# Patient Record
Sex: Female | Born: 1990 | Race: Black or African American | Hispanic: No | Marital: Single | Smoking: Current every day smoker
Health system: Southern US, Community
[De-identification: ages and names within clinical notes are randomized; demographics above are authoritative.]

## PROBLEM LIST (undated history)

## (undated) DIAGNOSIS — I639 Cerebral infarction, unspecified: Secondary | ICD-10-CM

## (undated) DIAGNOSIS — N289 Disorder of kidney and ureter, unspecified: Secondary | ICD-10-CM

## (undated) DIAGNOSIS — E119 Type 2 diabetes mellitus without complications: Secondary | ICD-10-CM

## (undated) DIAGNOSIS — I10 Essential (primary) hypertension: Secondary | ICD-10-CM

## (undated) DIAGNOSIS — R531 Weakness: Secondary | ICD-10-CM

## (undated) DIAGNOSIS — J189 Pneumonia, unspecified organism: Secondary | ICD-10-CM

## (undated) DIAGNOSIS — F419 Anxiety disorder, unspecified: Secondary | ICD-10-CM

## (undated) HISTORY — PX: TUBAL LIGATION: SHX77

---

## 2008-03-15 ENCOUNTER — Emergency Department (HOSPITAL_COMMUNITY): Admission: EM | Admit: 2008-03-15 | Discharge: 2008-03-15 | Payer: Self-pay | Admitting: Emergency Medicine

## 2010-04-20 LAB — POCT URINALYSIS DIP (DEVICE)
Bilirubin Urine: NEGATIVE
Ketones, ur: 15 mg/dL — AB
Nitrite: NEGATIVE

## 2010-04-20 LAB — POCT PREGNANCY, URINE: Preg Test, Ur: NEGATIVE

## 2012-04-28 DIAGNOSIS — O24111 Pre-existing diabetes mellitus, type 2, in pregnancy, first trimester: Secondary | ICD-10-CM | POA: Insufficient documentation

## 2013-02-02 DIAGNOSIS — E11319 Type 2 diabetes mellitus with unspecified diabetic retinopathy without macular edema: Secondary | ICD-10-CM | POA: Insufficient documentation

## 2013-02-02 DIAGNOSIS — E1121 Type 2 diabetes mellitus with diabetic nephropathy: Secondary | ICD-10-CM | POA: Insufficient documentation

## 2013-02-04 DIAGNOSIS — Z87891 Personal history of nicotine dependence: Secondary | ICD-10-CM | POA: Insufficient documentation

## 2013-06-13 ENCOUNTER — Emergency Department: Payer: Self-pay | Admitting: Internal Medicine

## 2013-10-15 ENCOUNTER — Emergency Department: Payer: Self-pay | Admitting: Emergency Medicine

## 2013-10-15 LAB — CBC
HCT: 39.9 % (ref 35.0–47.0)
HGB: 12.6 g/dL (ref 12.0–16.0)
MCH: 28.6 pg (ref 26.0–34.0)
MCHC: 31.6 g/dL — AB (ref 32.0–36.0)
MCV: 90 fL (ref 80–100)
Platelet: 242 10*3/uL (ref 150–440)
RBC: 4.41 10*6/uL (ref 3.80–5.20)
RDW: 13.4 % (ref 11.5–14.5)
WBC: 9.5 10*3/uL (ref 3.6–11.0)

## 2013-10-15 LAB — BASIC METABOLIC PANEL
Anion Gap: 6 — ABNORMAL LOW (ref 7–16)
BUN: 14 mg/dL (ref 7–18)
CHLORIDE: 107 mmol/L (ref 98–107)
CO2: 27 mmol/L (ref 21–32)
CREATININE: 0.64 mg/dL (ref 0.60–1.30)
Calcium, Total: 8.5 mg/dL (ref 8.5–10.1)
Glucose: 188 mg/dL — ABNORMAL HIGH (ref 65–99)
OSMOLALITY: 285 (ref 275–301)
Potassium: 3.8 mmol/L (ref 3.5–5.1)
SODIUM: 140 mmol/L (ref 136–145)

## 2013-10-15 LAB — URINALYSIS, COMPLETE
BACTERIA: NONE SEEN
BILIRUBIN, UR: NEGATIVE
KETONE: NEGATIVE
LEUKOCYTE ESTERASE: NEGATIVE
NITRITE: NEGATIVE
PH: 5 (ref 4.5–8.0)
Protein: 100
Specific Gravity: 1.024 (ref 1.003–1.030)
Squamous Epithelial: 9

## 2013-10-15 LAB — WET PREP, GENITAL

## 2013-10-16 LAB — GC/CHLAMYDIA PROBE AMP

## 2014-04-13 ENCOUNTER — Emergency Department
Admit: 2014-04-13 | Disposition: A | Discharge status: Home / Self Care | Payer: Self-pay | Admitting: Emergency Medicine

## 2014-04-13 LAB — BASIC METABOLIC PANEL
Anion Gap: 7 (ref 7–16)
BUN: 15 mg/dL
CHLORIDE: 103 mmol/L
CO2: 26 mmol/L
CREATININE: 0.8 mg/dL
Calcium, Total: 9.1 mg/dL
GLUCOSE: 241 mg/dL — AB
Potassium: 3.7 mmol/L
Sodium: 136 mmol/L

## 2014-04-13 LAB — CBC WITH DIFFERENTIAL/PLATELET
BASOS ABS: 0 10*3/uL (ref 0.0–0.1)
BASOS PCT: 0.5 %
EOS ABS: 0.1 10*3/uL (ref 0.0–0.7)
Eosinophil %: 1.2 %
HCT: 40.4 % (ref 35.0–47.0)
HGB: 13.6 g/dL (ref 12.0–16.0)
LYMPHS ABS: 1.1 10*3/uL (ref 1.0–3.6)
LYMPHS PCT: 11.3 %
MCH: 30 pg (ref 26.0–34.0)
MCHC: 33.6 g/dL (ref 32.0–36.0)
MCV: 89 fL (ref 80–100)
MONO ABS: 0.6 x10 3/mm (ref 0.2–0.9)
Monocyte %: 6.5 %
NEUTROS ABS: 7.7 10*3/uL — AB (ref 1.4–6.5)
Neutrophil %: 80.5 %
Platelet: 203 10*3/uL (ref 150–440)
RBC: 4.53 10*6/uL (ref 3.80–5.20)
RDW: 14.1 % (ref 11.5–14.5)
WBC: 9.6 10*3/uL (ref 3.6–11.0)

## 2014-04-13 LAB — ED INFLUENZA
Influenza A By PCR: NEGATIVE
Influenza B By PCR: NEGATIVE

## 2015-07-10 DIAGNOSIS — O10919 Unspecified pre-existing hypertension complicating pregnancy, unspecified trimester: Secondary | ICD-10-CM | POA: Insufficient documentation

## 2015-07-10 DIAGNOSIS — Z98891 History of uterine scar from previous surgery: Secondary | ICD-10-CM | POA: Insufficient documentation

## 2015-07-10 DIAGNOSIS — O09291 Supervision of pregnancy with other poor reproductive or obstetric history, first trimester: Secondary | ICD-10-CM | POA: Insufficient documentation

## 2015-07-11 DIAGNOSIS — F419 Anxiety disorder, unspecified: Secondary | ICD-10-CM | POA: Insufficient documentation

## 2017-10-29 DIAGNOSIS — N049 Nephrotic syndrome with unspecified morphologic changes: Secondary | ICD-10-CM | POA: Insufficient documentation

## 2018-02-02 DIAGNOSIS — O0991 Supervision of high risk pregnancy, unspecified, first trimester: Secondary | ICD-10-CM | POA: Insufficient documentation

## 2018-02-05 DIAGNOSIS — O2341 Unspecified infection of urinary tract in pregnancy, first trimester: Secondary | ICD-10-CM | POA: Insufficient documentation

## 2018-02-12 ENCOUNTER — Encounter: Payer: Self-pay | Admitting: Emergency Medicine

## 2018-02-12 ENCOUNTER — Other Ambulatory Visit: Payer: Self-pay

## 2018-02-12 ENCOUNTER — Emergency Department
Admission: EM | Admit: 2018-02-12 | Discharge: 2018-02-12 | Disposition: A | Discharge status: Home / Self Care | Payer: Medicaid Other | Attending: Emergency Medicine | Admitting: Emergency Medicine

## 2018-02-12 DIAGNOSIS — J020 Streptococcal pharyngitis: Secondary | ICD-10-CM | POA: Insufficient documentation

## 2018-02-12 DIAGNOSIS — I10 Essential (primary) hypertension: Secondary | ICD-10-CM | POA: Insufficient documentation

## 2018-02-12 DIAGNOSIS — J029 Acute pharyngitis, unspecified: Secondary | ICD-10-CM | POA: Diagnosis present

## 2018-02-12 DIAGNOSIS — E119 Type 2 diabetes mellitus without complications: Secondary | ICD-10-CM | POA: Insufficient documentation

## 2018-02-12 HISTORY — DX: Essential (primary) hypertension: I10

## 2018-02-12 HISTORY — DX: Type 2 diabetes mellitus without complications: E11.9

## 2018-02-12 LAB — INFLUENZA PANEL BY PCR (TYPE A & B)
INFLAPCR: NEGATIVE
INFLBPCR: NEGATIVE

## 2018-02-12 LAB — GROUP A STREP BY PCR: Group A Strep by PCR: DETECTED — AB

## 2018-02-12 MED ORDER — AMOXICILLIN 500 MG PO CAPS: 500.0000 mg | ORAL_CAPSULE | Freq: Three times a day (TID) | ORAL | 0 refills | Status: DC

## 2018-02-12 MED ORDER — ACETAMINOPHEN 325 MG PO TABS
650.0000 mg | ORAL_TABLET | Freq: Once | ORAL | Status: AC
  Administered 2018-02-12: 650 mg via ORAL
  Filled 2018-02-12: qty 2

## 2018-02-12 NOTE — ED Triage Notes (Addendum)
Patient reports sore throat, headache, generalized body aches and chills since last night. Reports significant pain with swallowing. Patient states she is [redacted] weeks pregnant.

## 2018-02-12 NOTE — Discharge Instructions (Signed)
You test positive for strep pharyngitis.  Follow discharge care instruction take antibiotics as directed.

## 2018-02-12 NOTE — ED Provider Notes (Signed)
[facility] Emergency Department Provider Note   ____________________________________________   First MD Initiated Contact with Patient 02/12/18 [phone]     (approximate)  I have reviewed the triage vital signs and the nursing notes.   HISTORY  Chief Complaint    HPI Joyce West is a 28 y.o. female patient presents with sore throat, headache, generalized body aches, and chills for 1 day.  Patient stated mild pain with swallowing.  Patient state she is [redacted] weeks pregnant.  Patient state nauseous possibly secondary to early gestation.  Patient denies vomiting or diarrhea.  Patient rates her pain discomfort as a 10/10.  No palliative measure for complaint.    Past Medical History:  Diagnosis Date  . Diabetes mellitus without complication ([place])   . Hypertension     There are no active problems to display for this patient.   Past Surgical History:  Procedure Laterality Date  . CESAREAN SECTION     x2    Prior to Admission medications   Medication Sig Start Date End Date Taking? Authorizing Provider  insulin glargine (LANTUS) 100 UNIT/ML injection Inject 22 Units into the skin at bedtime.   Yes [provider]  insulin lispro (HUMALOG) 100 UNIT/ML injection Inject 5 Units into the skin 3 (three) times daily with meals.   Yes [provider]  labetalol (NORMODYNE) 100 MG tablet Take 100 mg by mouth 2 (two) times daily.   Yes [provider]  Prenatal Vit-Fe Fumarate-FA (PRENATAL MULTIVITAMIN) TABS tablet Take 1 tablet by mouth daily at 12 noon.   Yes [provider]  amoxicillin (AMOXIL) 500 MG capsule Take 1 capsule (500 mg total) by mouth 3 (three) times daily. 02/12/18   Sable Feil, PA-C    Allergies Lisinopril  No family history on file.  Social History Social History   Tobacco Use  . Smoking status: Never Smoker  . Smokeless tobacco: Never Used  Substance Use Topics  . Alcohol use: Never   Frequency: Never  . Drug use: Never    Review of Systems Constitutional: Chills and body aches.   Eyes: No visual changes. ENT: Sore throat. Cardiovascular: Denies chest pain. Respiratory: Denies shortness of breath. Gastrointestinal: No abdominal pain.  No nausea, no vomiting.  No diarrhea.  No constipation. Genitourinary: Negative for dysuria. Musculoskeletal: Negative for back pain. Skin: Negative for rash. Neurological: Negative for headaches, focal weakness or numbness. Endocrine:  Diabetes and hypertension. Allergic/Immunilogical: Lisinopril. ____________________________________________   PHYSICAL EXAM:  VITAL SIGNS: ED Triage Vitals  Enc Vitals Group     BP 02/12/18 0738 (!) 142/87     Pulse Rate 02/12/18 0738 (!) 110     Resp 02/12/18 0738 18     Temp 02/12/18 0738 99.7 F (37.6 C)     Temp Source 02/12/18 0738 Oral     SpO2 02/12/18 0738 99 %     Weight 02/12/18 0742 190 lb (86.2 kg)     Height 02/12/18 0742 5\' 4"  (1.626 m)     Head Circumference --      Peak Flow --      Pain Score 02/12/18 0742 10     Pain Loc --      Pain Edu? --      Excl. in [place]? --     Constitutional: Alert and oriented. Well appearing and in no acute distress. Eyes: Conjunctivae are normal. PERRL. EOMI. Head: Atraumatic. Nose: No congestion/rhinnorhea. Mouth/Throat: Mucous membranes are moist.  Oropharynx erythematous. Neck: No  stridor.  Hematological/Lymphatic/Immunilogical: Bilateral cervical lymphadenopathy. Cardiovascular: Tachycardic, regular rhythm. Grossly normal heart sounds.  Good peripheral circulation. Respiratory: Normal respiratory effort.  No retractions. Lungs CTAB. Gastrointestinal: Soft and nontender. No distention. No abdominal bruits. No CVA tenderness. Musculoskeletal: No lower extremity tenderness nor edema.  No joint effusions. Neurologic:  Normal speech and language. No gross focal neurologic deficits are appreciated. No gait instability. Skin:  Skin is warm,  dry and intact. No rash noted. Psychiatric: Mood and affect are normal. Speech and behavior are normal.  ____________________________________________   LABS (all labs ordered are listed, but only abnormal results are displayed)  Labs Reviewed  GROUP A STREP BY PCR - Abnormal; Notable for the following components:      Result Value   Group A Strep by PCR DETECTED (*)    All other components within normal limits  INFLUENZA PANEL BY PCR (TYPE A & B)   ____________________________________________  EKG   ____________________________________________  RADIOLOGY  ED MD interpretation:    Official radiology report(s): No results found.  ____________________________________________   PROCEDURES  Procedure(s) performed: None  Procedures  Critical Care performed: No  ____________________________________________   INITIAL IMPRESSION / ASSESSMENT AND PLAN / ED COURSE  As part of my medical decision making, I reviewed the following data within the [place]    Patient presents with sore throat headache generalized body ache and chills.  Patient tested positive for strep pharyngitis with negative for negative for influenza.  Patient given discharge care instruction advised take medication as directed.  Patient advised follow-up PCP if condition persist.   ____________________________________________   FINAL CLINICAL IMPRESSION(S) / ED DIAGNOSES  Final diagnoses:  Strep pharyngitis     ED Discharge Orders         Ordered    amoxicillin (AMOXIL) 500 MG capsule  3 times daily     02/12/18 0174           Note:  This document was prepared using Dragon voice recognition software and may include unintentional dictation errors.    Sable Feil, PA-C 02/12/18 9449    Earleen Newport, MD 02/12/18 1040

## 2018-02-12 NOTE — ED Notes (Signed)
PT reports illness 1 week ago that got better and now has returned.  Pt reports sore throat, bodyache and general feeling of unwellness.

## 2018-04-08 DIAGNOSIS — E119 Type 2 diabetes mellitus without complications: Secondary | ICD-10-CM | POA: Insufficient documentation

## 2018-04-08 DIAGNOSIS — O162 Unspecified maternal hypertension, second trimester: Secondary | ICD-10-CM | POA: Insufficient documentation

## 2018-04-09 DIAGNOSIS — O99212 Obesity complicating pregnancy, second trimester: Secondary | ICD-10-CM | POA: Insufficient documentation

## 2018-04-09 DIAGNOSIS — F419 Anxiety disorder, unspecified: Secondary | ICD-10-CM | POA: Insufficient documentation

## 2019-04-22 ENCOUNTER — Emergency Department
Admission: EM | Admit: 2019-04-22 | Discharge: 2019-04-22 | Disposition: A | Discharge status: Home / Self Care | Payer: Medicaid Other | Attending: Internal Medicine | Admitting: Internal Medicine

## 2019-04-22 ENCOUNTER — Other Ambulatory Visit: Payer: Self-pay

## 2019-04-22 ENCOUNTER — Emergency Department: Payer: Medicaid Other

## 2019-04-22 ENCOUNTER — Encounter: Payer: Self-pay | Admitting: Emergency Medicine

## 2019-04-22 DIAGNOSIS — Z79899 Other long term (current) drug therapy: Secondary | ICD-10-CM | POA: Diagnosis not present

## 2019-04-22 DIAGNOSIS — Z20822 Contact with and (suspected) exposure to covid-19: Secondary | ICD-10-CM | POA: Insufficient documentation

## 2019-04-22 DIAGNOSIS — I129 Hypertensive chronic kidney disease with stage 1 through stage 4 chronic kidney disease, or unspecified chronic kidney disease: Secondary | ICD-10-CM | POA: Diagnosis not present

## 2019-04-22 DIAGNOSIS — R1030 Lower abdominal pain, unspecified: Secondary | ICD-10-CM | POA: Diagnosis not present

## 2019-04-22 DIAGNOSIS — N183 Chronic kidney disease, stage 3 unspecified: Secondary | ICD-10-CM | POA: Diagnosis not present

## 2019-04-22 DIAGNOSIS — R102 Pelvic and perineal pain: Secondary | ICD-10-CM | POA: Diagnosis present

## 2019-04-22 DIAGNOSIS — N179 Acute kidney failure, unspecified: Secondary | ICD-10-CM | POA: Diagnosis not present

## 2019-04-22 DIAGNOSIS — E1122 Type 2 diabetes mellitus with diabetic chronic kidney disease: Secondary | ICD-10-CM | POA: Insufficient documentation

## 2019-04-22 DIAGNOSIS — E119 Type 2 diabetes mellitus without complications: Secondary | ICD-10-CM

## 2019-04-22 DIAGNOSIS — Z888 Allergy status to other drugs, medicaments and biological substances status: Secondary | ICD-10-CM | POA: Insufficient documentation

## 2019-04-22 DIAGNOSIS — I10 Essential (primary) hypertension: Secondary | ICD-10-CM | POA: Diagnosis present

## 2019-04-22 LAB — COMPREHENSIVE METABOLIC PANEL
ALT: 13 U/L (ref 0–44)
AST: 13 U/L — ABNORMAL LOW (ref 15–41)
Albumin: 2.8 g/dL — ABNORMAL LOW (ref 3.5–5.0)
Alkaline Phosphatase: 121 U/L (ref 38–126)
Anion gap: 8 (ref 5–15)
BUN: 59 mg/dL — ABNORMAL HIGH (ref 6–20)
CO2: 20 mmol/L — ABNORMAL LOW (ref 22–32)
Calcium: 7.7 mg/dL — ABNORMAL LOW (ref 8.9–10.3)
Chloride: 110 mmol/L (ref 98–111)
Creatinine, Ser: 5.37 mg/dL — ABNORMAL HIGH (ref 0.44–1.00)
GFR calc Af Amer: 11 mL/min — ABNORMAL LOW (ref >60)
GFR calc non Af Amer: 10 mL/min — ABNORMAL LOW (ref >60)
Glucose, Bld: 175 mg/dL — ABNORMAL HIGH (ref 70–99)
Potassium: 4 mmol/L (ref 3.5–5.1)
Sodium: 138 mmol/L (ref 135–145)
Total Bilirubin: 0.9 mg/dL (ref 0.3–1.2)
Total Protein: 5.9 g/dL — ABNORMAL LOW (ref 6.5–8.1)

## 2019-04-22 LAB — URINALYSIS, ROUTINE W REFLEX MICROSCOPIC
Bilirubin Urine: NEGATIVE
Glucose, UA: >=500 mg/dL — AB
Hgb urine dipstick: NEGATIVE
Ketones, ur: NEGATIVE mg/dL
Leukocytes,Ua: NEGATIVE
Nitrite: NEGATIVE
Protein, ur: >=300 mg/dL — AB
Specific Gravity, Urine: 1.011 (ref 1.005–1.030)
pH: 6 (ref 5.0–8.0)

## 2019-04-22 LAB — CBC WITH DIFFERENTIAL/PLATELET
Abs Immature Granulocytes: 0.02 10*3/uL (ref 0.00–0.07)
Basophils Absolute: 0.1 10*3/uL (ref 0.0–0.1)
Basophils Relative: 1 %
Eosinophils Absolute: 0.2 10*3/uL (ref 0.0–0.5)
Eosinophils Relative: 3 %
HCT: 30 % — ABNORMAL LOW (ref 36.0–46.0)
Hemoglobin: 10 g/dL — ABNORMAL LOW (ref 12.0–15.0)
Immature Granulocytes: 0 %
Lymphocytes Relative: 21 %
Lymphs Abs: 1.6 10*3/uL (ref 0.7–4.0)
MCH: 28.9 pg (ref 26.0–34.0)
MCHC: 33.3 g/dL (ref 30.0–36.0)
MCV: 86.7 fL (ref 80.0–100.0)
Monocytes Absolute: 0.4 10*3/uL (ref 0.1–1.0)
Monocytes Relative: 6 %
Neutro Abs: 5.3 10*3/uL (ref 1.7–7.7)
Neutrophils Relative %: 69 %
Platelets: 235 10*3/uL (ref 150–400)
RBC: 3.46 MIL/uL — ABNORMAL LOW (ref 3.87–5.11)
RDW: 13.5 % (ref 11.5–15.5)
WBC: 7.7 10*3/uL (ref 4.0–10.5)
nRBC: 0 % (ref 0.0–0.2)

## 2019-04-22 LAB — URINALYSIS, COMPLETE (UACMP) WITH MICROSCOPIC
Bacteria, UA: NONE SEEN
Bilirubin Urine: NEGATIVE
Glucose, UA: 150 mg/dL — AB
Ketones, ur: NEGATIVE mg/dL
Leukocytes,Ua: NEGATIVE
Nitrite: NEGATIVE
Protein, ur: >=300 mg/dL — AB
RBC / HPF: >50 RBC/hpf — ABNORMAL HIGH (ref 0–5)
Specific Gravity, Urine: 1.014 (ref 1.005–1.030)
pH: 6 (ref 5.0–8.0)

## 2019-04-22 LAB — CREATININE, SERUM
Creatinine, Ser: 4.93 mg/dL — ABNORMAL HIGH (ref 0.44–1.00)
GFR calc Af Amer: 13 mL/min — ABNORMAL LOW (ref >60)
GFR calc non Af Amer: 11 mL/min — ABNORMAL LOW (ref >60)

## 2019-04-22 LAB — HEMOGLOBIN A1C
Hgb A1c MFr Bld: 7.4 % — ABNORMAL HIGH (ref 4.8–5.6)
Mean Plasma Glucose: 165.68 mg/dL

## 2019-04-22 LAB — CK: Total CK: 181 U/L (ref 38–234)

## 2019-04-22 LAB — GLUCOSE, CAPILLARY: Glucose-Capillary: 168 mg/dL — ABNORMAL HIGH (ref 70–99)

## 2019-04-22 LAB — HIV ANTIBODY (ROUTINE TESTING W REFLEX): HIV Screen 4th Generation wRfx: NONREACTIVE

## 2019-04-22 LAB — SARS CORONAVIRUS 2 (TAT 6-24 HRS): SARS Coronavirus 2: NEGATIVE

## 2019-04-22 LAB — POCT PREGNANCY, URINE: Preg Test, Ur: NEGATIVE

## 2019-04-22 MED ORDER — ONDANSETRON HCL 4 MG PO TABS: 4.0000 mg | ORAL_TABLET | Freq: Four times a day (QID) | ORAL | Status: DC | PRN

## 2019-04-22 MED ORDER — MORPHINE SULFATE (PF) 4 MG/ML IV SOLN
4.0000 mg | Freq: Once | INTRAVENOUS | Status: AC
  Administered 2019-04-22: 4 mg via INTRAVENOUS
  Filled 2019-04-22: qty 1

## 2019-04-22 MED ORDER — SODIUM CHLORIDE 0.9 % IV SOLN: INTRAVENOUS | Status: DC

## 2019-04-22 MED ORDER — TRAMADOL HCL 50 MG PO TABS: 50.0000 mg | ORAL_TABLET | Freq: Four times a day (QID) | ORAL | 0 refills | Status: AC | PRN

## 2019-04-22 MED ORDER — ONDANSETRON HCL 4 MG/2ML IJ SOLN
4.0000 mg | Freq: Once | INTRAMUSCULAR | Status: AC
  Administered 2019-04-22: 4 mg via INTRAVENOUS
  Filled 2019-04-22: qty 2

## 2019-04-22 MED ORDER — LABETALOL HCL 5 MG/ML IV SOLN
10.0000 mg | Freq: Four times a day (QID) | INTRAVENOUS | Status: DC | PRN
  Administered 2019-04-22: 10 mg via INTRAVENOUS
  Filled 2019-04-22: qty 4

## 2019-04-22 MED ORDER — ONDANSETRON HCL 4 MG/2ML IJ SOLN: 4.0000 mg | Freq: Four times a day (QID) | INTRAMUSCULAR | Status: DC | PRN

## 2019-04-22 MED ORDER — HYDRALAZINE HCL 50 MG PO TABS
25.0000 mg | ORAL_TABLET | Freq: Four times a day (QID) | ORAL | Status: DC
  Administered 2019-04-22: 25 mg via ORAL
  Filled 2019-04-22: qty 1

## 2019-04-22 MED ORDER — ACETAMINOPHEN 650 MG RE SUPP: 650.0000 mg | Freq: Four times a day (QID) | RECTAL | Status: DC | PRN

## 2019-04-22 MED ORDER — AMLODIPINE BESYLATE 5 MG PO TABS
10.0000 mg | ORAL_TABLET | Freq: Every day | ORAL | Status: DC
  Administered 2019-04-22: 10 mg via ORAL
  Filled 2019-04-22: qty 2

## 2019-04-22 MED ORDER — ACETAMINOPHEN 325 MG PO TABS: 650.0000 mg | ORAL_TABLET | Freq: Four times a day (QID) | ORAL | Status: DC | PRN

## 2019-04-22 MED ORDER — HEPARIN SODIUM (PORCINE) 5000 UNIT/ML IJ SOLN: 5000.0000 [IU] | Freq: Three times a day (TID) | INTRAMUSCULAR | Status: DC

## 2019-04-22 MED ORDER — INSULIN ASPART 100 UNIT/ML ~~LOC~~ SOLN
0.0000 [IU] | SUBCUTANEOUS | Status: DC
  Administered 2019-04-22: 3 [IU] via SUBCUTANEOUS
  Filled 2019-04-22: qty 1

## 2019-04-22 NOTE — H&P (Addendum)
History and Physical    Avon Molock QQI:[id number] DOB: 03-Nov-1990 DOA: 04/22/2019  PCP: System, Pcp Not In   Patient coming from: Home  I have personally briefly reviewed patient's old medical records in [place]  Chief Complaint: Abdominal pain  HPI: Joyce West is a 29 y.o. female with medical history significant for diabetes mellitus with complications of chronic kidney disease, hypertension and medication noncompliance presents to the emergency room for evaluation of abdominal pain mostly in the right lower quadrant has been going on for months since her last cesarean section in June.  The intensity of the pain increased over the last couple of weeks which prompted her visit to the emergency room. She also complains of nausea and vomiting states that she has been unable to tolerate oral intake.  She had a CT scan of abdomen and pelvis done without contrast which did not show any acute findings.  Labs revealed a serum creatinine of 5.37 when compared to a baseline of 2 according to the patient. She admits to being non compliant with prescribed meds due to side effects of nausea and vomiting. She states that her medications were recently changed by her endocrinologist. She denies having any dysuria, no frequency of urination, no nocturia.  She denies having any fever or chills.  She denies feeling dizzy or lightheaded.  She will be admitted to the hospital for further evaluation  ED Course: Patient seen in the emergency room and admission requested for acute kidney injury  Review of Systems: As per HPI otherwise 10 point review of systems negative.    Past Medical History:  Diagnosis Date  . Diabetes mellitus without complication ([employer])   . Hypertension     Past Surgical History:  Procedure Laterality Date  . CESAREAN SECTION     x2  . TUBAL LIGATION       reports that she has never smoked. She has never used smokeless tobacco. She reports that she does not drink  alcohol or use drugs.  Allergies  Allergen Reactions  . Lisinopril Cough    No family history on file.   Prior to Admission medications   Medication Sig Start Date End Date Taking? Authorizing Provider  amLODipine (NORVASC) 5 MG tablet Take 10 mg by mouth daily.   Yes [provider]  hydrALAZINE (APRESOLINE) 25 MG tablet Take 25 mg by mouth 4 (four) times daily.    Yes [provider]  liraglutide (VICTOZA) 18 MG/3ML SOPN Inject 1.8 mg into the skin daily.    Yes [provider]    Physical Exam: Vitals:   04/22/19 1010 04/22/19 1017 04/22/19 1230  BP:  (!) 154/115 (!) 150/100  Pulse:  97 90  Resp:  14 16  Temp:  98 F (36.7 C)   TempSrc:  Oral   SpO2:  100% 99%  Weight: 86.2 kg 86.2 kg   Height: 5\' 4"  (1.626 m) 5\' 4"  (1.626 m)      Vitals:   04/22/19 1010 04/22/19 1017 04/22/19 1230  BP:  (!) 154/115 (!) 150/100  Pulse:  97 90  Resp:  14 16  Temp:  98 F (36.7 C)   TempSrc:  Oral   SpO2:  100% 99%  Weight: 86.2 kg 86.2 kg   Height: 5\' 4"  (1.626 m) 5\' 4"  (1.626 m)     Constitutional: NAD, alert and oriented x 3 Eyes: PERRL, lids and conjunctivae normal, pale conjunctiva ENMT: Mucous membranes are moist.  Neck: normal, supple, no  masses, no thyromegaly Respiratory: clear to auscultation bilaterally, no wheezing, no crackles. Normal respiratory effort. No accessory muscle use. Cardiovascular: Regular rate and rhythm, no murmurs / rubs / gallops. No extremity edema. 2+ pedal pulses. No carotid bruits.  Abdomen: tenderness in the RLQ, no masses palpated. No hepatosplenomegaly. Bowel sounds positive. Central adiposity Musculoskeletal: no clubbing / cyanosis. No joint deformity upper and lower extremities.  Skin: no rashes, lesions, ulcers.  Neurologic: No gross focal neurologic deficit. Psychiatric: Normal mood and affect.   Labs on Admission: I have personally reviewed following labs and imaging studies  CBC: Recent Labs  Lab  04/22/19 1049  WBC 7.7  NEUTROABS 5.3  HGB 10.0*  HCT 30.0*  MCV 86.7  PLT 696   Basic Metabolic Panel: Recent Labs  Lab 04/22/19 1049 04/22/19 1342  NA 138  --   K 4.0  --   CL 110  --   CO2 20*  --   GLUCOSE 175*  --   BUN 59*  --   CREATININE 5.37* 4.93*  CALCIUM 7.7*  --    GFR: Estimated Creatinine Clearance: 17.6 mL/min (A) (by C-G formula based on SCr of 4.93 mg/dL (H)). Liver Function Tests: Recent Labs  Lab 04/22/19 1049  AST 13*  ALT 13  ALKPHOS 121  BILITOT 0.9  PROT 5.9*  ALBUMIN 2.8*   No results for input(s): LIPASE, AMYLASE in the last 168 hours. No results for input(s): AMMONIA in the last 168 hours. Coagulation Profile: No results for input(s): INR, PROTIME in the last 168 hours. Cardiac Enzymes: Recent Labs  Lab 04/22/19 1049  CKTOTAL 181   BNP (last 3 results) No results for input(s): PROBNP in the last 8760 hours. HbA1C: No results for input(s): HGBA1C in the last 72 hours. CBG: No results for input(s): GLUCAP in the last 168 hours. Lipid Profile: No results for input(s): CHOL, HDL, LDLCALC, TRIG, CHOLHDL, LDLDIRECT in the last 72 hours. Thyroid Function Tests: No results for input(s): TSH, T4TOTAL, FREET4, T3FREE, THYROIDAB in the last 72 hours. Anemia Panel: No results for input(s): VITAMINB12, FOLATE, FERRITIN, TIBC, IRON, RETICCTPCT in the last 72 hours. Urine analysis:    Component Value Date/Time   COLORURINE RED (A) 04/22/2019 1049   APPEARANCEUR HAZY (A) 04/22/2019 1049   APPEARANCEUR Hazy 10/15/2013 1506   LABSPEC 1.014 04/22/2019 1049   LABSPEC 1.024 10/15/2013 1506   PHURINE 6.0 04/22/2019 1049   GLUCOSEU 150 (A) 04/22/2019 1049   GLUCOSEU >=500 10/15/2013 1506   HGBUR LARGE (A) 04/22/2019 1049   BILIRUBINUR NEGATIVE 04/22/2019 1049   BILIRUBINUR Negative 10/15/2013 1506   KETONESUR NEGATIVE 04/22/2019 1049   PROTEINUR >=300 (A) 04/22/2019 1049   UROBILINOGEN 0.2 03/15/2008 1619   NITRITE NEGATIVE 04/22/2019  1049   LEUKOCYTESUR NEGATIVE 04/22/2019 1049   LEUKOCYTESUR Negative 10/15/2013 1506    Radiological Exams on Admission: CT ABDOMEN PELVIS WO CONTRAST  Result Date: 04/22/2019 CLINICAL DATA:  29 year old female with history of right lower quadrant abdominal pain for 1 month with nausea and vomiting. EXAM: CT ABDOMEN AND PELVIS WITHOUT CONTRAST TECHNIQUE: Multidetector CT imaging of the abdomen and pelvis was performed following the standard protocol without IV contrast. COMPARISON:  No priors. FINDINGS: Lower chest: Unremarkable. Hepatobiliary: No suspicious cystic or solid hepatic lesions are confidently identified on today's noncontrast CT examination. Pancreas: No definite pancreatic mass or peripancreatic fluid collections or inflammatory changes are noted on today's noncontrast CT examination. Spleen: Unremarkable. Adrenals/Urinary Tract: Tiny nonobstructive calculi are noted in the collecting systems  of both kidneys measuring up to 2 mm in the interpolar region of the right kidney. No additional calculi are noted along the course of either ureter or within the lumen of the urinary bladder. No hydroureteronephrosis. Unenhanced appearance of the kidneys and bilateral adrenal glands are normal. Urinary bladder is normal in appearance. Stomach/Bowel: Unenhanced appearance of the stomach is normal. No pathologic dilatation of small bowel or colon. Normal appendix. Vascular/Lymphatic: No atherosclerotic calcifications in the abdominal aorta or pelvic vasculature. No pathologically enlarged lymph nodes are noted in the abdomen or pelvis. Several prominent borderline enlarged retroperitoneal lymph nodes are noted, nonspecific, but measuring up to 9 mm in the left para-aortic nodal station. Reproductive: Unenhanced appearance of the uterus and ovaries is unremarkable in appearance. Other: Trace volume of free fluid in the cul-de-sac, presumably physiologic in this young female patient. No larger volume of  ascites. No pneumoperitoneum. Musculoskeletal: There are no aggressive appearing lytic or blastic lesions noted in the visualized portions of the skeleton. IMPRESSION: 1. No acute findings are noted in the abdomen or pelvis to account for the patient's symptoms. 2. Trace volume of free fluid in the cul-de-sac, presumably physiologic in this young female patient. 3. Normal appendix. Electronically Signed   By: Vinnie Langton M.D.   On: 04/22/2019 12:01    EKG: Independently reviewed.   Assessment/Plan Principal Problem:   AKI (acute kidney injury) ([place]) Active Problems:   Hypertension   Type 2 diabetes mellitus with stage 3 chronic kidney disease ([place])     AKI Patient has a history of chronic kidney disease, stage 3 as a complication of her known diabetes mellitus. Her baseline serum creatinine is about 2 Today on admission her serum creatinine is 5.7 She has a history of nephrotic syndrome Worsening renal function may be prerenal and related to volume depletion from nausea and vomiting vs secondary to poorly controlled diabetes mellitus and hypertension Hydrate patient Repeat renal parameters Consult nephrology   Diabetes mellitus with complications of stage III chronic kidney disease Patient is n.p.o. due to nausea and vomiting IV fluid hydration Glycemic control   Hypertension Uncontrolled Place patient on amlodipine 10 mg daily Continue scheduled hydralazine IV Labetalol as needed for SBP > 171mmHg  DVT prophylaxis: Heparin Code Status:  Full Family Communication: Plan of care was discussed with patient in detail. She verbalizes understanding and agrees with the plan Disposition Plan: Back to previous home environment Consults called: Nephrology    Collier Bullock MD [facility]     04/22/2019, 2:43 PM

## 2019-04-22 NOTE — ED Triage Notes (Signed)
C/O right sided pelvic pain for several months, pain worsens with menstrual cycle. Denies dysuria, vaginal discharge.    Patient is AAOx3.  Skin warm and dry. NAD.

## 2019-04-22 NOTE — ED Notes (Addendum)
See triage note   Presents with some abd pain/ cramping  States pain is mainly on the right   Worse with periods  States she P/P 6/20  States she also had her tubes tied at that time    States she has felt a "knot" to right side of abd  States she noticed the area since her c-section and tubal ligation

## 2019-04-22 NOTE — Discharge Instructions (Signed)
Follow-up with Dr. Candiss Norse. You need immediate follow-up due to your kidney functions. Please return the emergency department if you are worsening

## 2019-04-22 NOTE — ED Provider Notes (Addendum)
[facility] Emergency Department Provider Note  ____________________________________________   First MD Initiated Contact with Patient 04/22/19 1022     (approximate)  I have reviewed the triage vital signs and the nursing notes.   HISTORY  Chief Complaint Pelvic Pain    HPI Tationa Stech is a 29 y.o. female presents emergency department complaint of lower abdominal pain.  Patient states it is along her C-section line.  She has felt a knot since her last C-section approximately 1 year ago.  She states pain gets much worse when she starts her menstrual cycle.  She has been pulling threads out since the C-section was performed.  States she does find them in random places even up under her breasts.  She denies any fever or chills.  No concerns for STDs.  No vaginal discharge.  Patient is currently on her menses.    Past Medical History:  Diagnosis Date  . Diabetes mellitus without complication ([place])   . Hypertension     Patient Active Problem List   Diagnosis Date Noted  . AKI (acute kidney injury) ([place]) 04/22/2019    Past Surgical History:  Procedure Laterality Date  . CESAREAN SECTION     x2  . TUBAL LIGATION      Prior to Admission medications   Medication Sig Start Date End Date Taking? Authorizing Provider  amLODipine (NORVASC) 5 MG tablet Take 10 mg by mouth daily.   Yes [provider]  hydrALAZINE (APRESOLINE) 25 MG tablet Take 25 mg by mouth 4 (four) times daily.    Yes [provider]  liraglutide (VICTOZA) 18 MG/3ML SOPN Inject 1.8 mg into the skin daily.    Yes [provider]    Allergies Lisinopril  No family history on file.  Social History Social History   Tobacco Use  . Smoking status: Never Smoker  . Smokeless tobacco: Never Used  Substance Use Topics  . Alcohol use: Never  . Drug use: Never    Review of Systems  Constitutional: No fever/chills Eyes: No visual changes. ENT: No sore  throat. Respiratory: Denies cough Cardiovascular: Denies chest pain Gastrointestinal: Positive abdominal pain Genitourinary: Negative for dysuria. Musculoskeletal: Negative for back pain. Skin: Negative for rash. Psychiatric: no mood changes,     ____________________________________________   PHYSICAL EXAM:  VITAL SIGNS: ED Triage Vitals  Enc Vitals Group     BP 04/22/19 1017 (!) 154/115     Pulse Rate 04/22/19 1017 97     Resp 04/22/19 1017 14     Temp 04/22/19 1017 98 F (36.7 C)     Temp Source 04/22/19 1017 Oral     SpO2 04/22/19 1017 100 %     Weight 04/22/19 1010 190 lb 0.6 oz (86.2 kg)     Height 04/22/19 1010 5\' 4"  (1.626 m)     Head Circumference --      Peak Flow --      Pain Score 04/22/19 1010 8     Pain Loc --      Pain Edu? --      Excl. in [place]? --     Constitutional: Alert and oriented. Well appearing and in no acute distress. Eyes: Conjunctivae are normal.  Head: Atraumatic. Nose: No congestion/rhinnorhea. Mouth/Throat: Mucous membranes are moist.   Neck:  supple no lymphadenopathy noted Cardiovascular: Normal rate, regular rhythm.  Respiratory: Normal respiratory effort.  No retractions, Abd: soft tender at the C-section line, hard knot is noted in this area also,  bs normal all 4 quad GU: deferred Musculoskeletal: FROM all extremities, warm and well perfused Neurologic:  Normal speech and language.  Skin:  Skin is warm, dry and intact. No rash noted. Psychiatric: Mood and affect are normal. Speech and behavior are normal.  ____________________________________________   LABS (all labs ordered are listed, but only abnormal results are displayed)  Labs Reviewed  URINALYSIS, COMPLETE (UACMP) WITH MICROSCOPIC - Abnormal; Notable for the following components:      Result Value   Color, Urine RED (*)    APPearance HAZY (*)    Glucose, UA 150 (*)    Hgb urine dipstick LARGE (*)    Protein, ur >=300 (*)    RBC / HPF >50 (*)    All other  components within normal limits  COMPREHENSIVE METABOLIC PANEL - Abnormal; Notable for the following components:   CO2 20 (*)    Glucose, Bld 175 (*)    BUN 59 (*)    Creatinine, Ser 5.37 (*)    Calcium 7.7 (*)    Total Protein 5.9 (*)    Albumin 2.8 (*)    AST 13 (*)    GFR calc non Af Amer 10 (*)    GFR calc Af Amer 11 (*)    All other components within normal limits  CBC WITH DIFFERENTIAL/PLATELET - Abnormal; Notable for the following components:   RBC 3.46 (*)    Hemoglobin 10.0 (*)    HCT 30.0 (*)    All other components within normal limits  SARS CORONAVIRUS 2 (TAT 6-24 HRS)  CK  HIV ANTIBODY (ROUTINE TESTING W REFLEX)  CREATININE, SERUM  HEMOGLOBIN A1C  POC URINE PREG, ED  POCT PREGNANCY, URINE   ____________________________________________   ____________________________________________  RADIOLOGY  CT abdomen/pelvis is negative  ____________________________________________   PROCEDURES  Procedure(s) performed: No  Procedures    ____________________________________________   INITIAL IMPRESSION / ASSESSMENT AND PLAN / ED COURSE  Pertinent labs & imaging results that were available during my care of the patient were reviewed by me and considered in my medical decision making (see chart for details).   Patient is a 29 year old female presents emergency department with lower abdominal pain.  See HPI  Physical exam does show a hard knot at the C-section line, no redness noted, remainder the exam is unremarkable  DDx: Hernia secondary C-section, fibroid, retained products from surgery  CBC, metabolic panel, CT abdomen/pelvis with IV contrast  CBC shows decreased H&H which are chronic for the patient.,  Urinalysis has a large amount of hemoglobin, glucose, protein, and red blood cells.  Most of the blood and protein is most likely due to her being on her menses.  Comprehensive metabolic panel showed high levels of BUN and creatinine.  These numbers have  doubled since 2020.  Do feel patient needs to be admitted for AKI due to these levels.  Her CK is normal.  POC pregnancy is negative  CT abdomen/pelvis without contrast due to her kidney functions did not show any acute abnormalities.  I had a long discussion with the patient about the AKI.  Felt that she needs to be admitted.  Patient is struggling with childcare issues.  I explained to her that she most likely would end up in a dialysis chair 3 times a week if she did not let us help her at this time.  She states that she will try to see if her boyfriend can keep her kids or her mother.  She was given a  phone to try and call them.  I discussed the case with the hospitalist.  She is going to admit the patient for AKI. Clinical Course as of Apr 22 1611  Wed Apr 22, 2019  1406 Sodium: 138 [SF]  1407 WBC: 7.7 [SF]    Clinical Course User Index [SF] Versie Starks, PA-C   ----------------------------------------- 4:14 PM on 04/22/2019 -----------------------------------------  The patient was unable to find childcare for her 3 small children. I tried to call her physician at [facility]. I did get to talk to a nurse. Her physician is not in the clinic this week. I advised the nurse that I could have her follow-up with Dr. Candiss Norse here in [place]. We have decided this would be the best plan. She was given Dr. Keturah Barre phone number for Dr. Doy Mince to communicate with. I did discuss the case with Dr. Candiss Norse. He was aware that the patient may have to leave the hospital. He advised me to have her follow-up in his office. His office will try to call her tomorrow if she does not hear from them she is to call them to be seen by Monday. If she is worsening she will return the emergency department after arranging care for her children.   Kieli Golladay was evaluated in Emergency Department on 04/22/2019 for the symptoms described in the history of present illness. She was evaluated in the context of the global  COVID-19 pandemic, which necessitated consideration that the patient might be at risk for infection with the SARS-CoV-2 virus that causes COVID-19. Institutional protocols and algorithms that pertain to the evaluation of patients at risk for COVID-19 are in a state of rapid change based on information released by regulatory bodies including the CDC and federal and state organizations. These policies and algorithms were followed during the patient's care in the ED.   As part of my medical decision making, I reviewed the following data within the [place] notes reviewed and incorporated, Labs reviewed , Old chart reviewed, Radiograph reviewed , Discussed with admitting physician hospitalist, Notes from prior ED visits and [place] Controlled Substance Database  ____________________________________________   FINAL CLINICAL IMPRESSION(S) / ED DIAGNOSES  Final diagnoses:  AKI (acute kidney injury) ([place])  Lower abdominal pain  Essential hypertension  Diabetes mellitus type 2, noninsulin dependent ([place])      NEW MEDICATIONS STARTED DURING THIS VISIT:  New Prescriptions   No medications on file     Note:  This document was prepared using Dragon voice recognition software and may include unintentional dictation errors.    Versie Starks, PA-C 04/22/19 1329    Vanessa Ambridge, MD 04/22/19 1401    Versie Starks, PA-C 04/22/19 1615    Vanessa Haynes, MD 04/23/19 [phone]

## 2019-04-24 ENCOUNTER — Other Ambulatory Visit: Payer: Self-pay | Admitting: Nephrology

## 2019-04-24 DIAGNOSIS — N185 Chronic kidney disease, stage 5: Secondary | ICD-10-CM

## 2019-04-28 ENCOUNTER — Encounter: Payer: Self-pay | Admitting: Emergency Medicine

## 2019-04-28 ENCOUNTER — Emergency Department
Admission: EM | Admit: 2019-04-28 | Discharge: 2019-04-29 | Disposition: A | Discharge status: Other Acute Inpatient Hospital | Payer: Medicaid Other | Attending: Emergency Medicine | Admitting: Emergency Medicine

## 2019-04-28 ENCOUNTER — Other Ambulatory Visit: Payer: Self-pay

## 2019-04-28 ENCOUNTER — Emergency Department: Payer: Medicaid Other

## 2019-04-28 DIAGNOSIS — Z79899 Other long term (current) drug therapy: Secondary | ICD-10-CM | POA: Insufficient documentation

## 2019-04-28 DIAGNOSIS — N183 Chronic kidney disease, stage 3 unspecified: Secondary | ICD-10-CM | POA: Diagnosis not present

## 2019-04-28 DIAGNOSIS — Z20822 Contact with and (suspected) exposure to covid-19: Secondary | ICD-10-CM | POA: Insufficient documentation

## 2019-04-28 DIAGNOSIS — I639 Cerebral infarction, unspecified: Secondary | ICD-10-CM

## 2019-04-28 DIAGNOSIS — E1122 Type 2 diabetes mellitus with diabetic chronic kidney disease: Secondary | ICD-10-CM | POA: Insufficient documentation

## 2019-04-28 DIAGNOSIS — I159 Secondary hypertension, unspecified: Secondary | ICD-10-CM

## 2019-04-28 DIAGNOSIS — I129 Hypertensive chronic kidney disease with stage 1 through stage 4 chronic kidney disease, or unspecified chronic kidney disease: Secondary | ICD-10-CM | POA: Diagnosis not present

## 2019-04-28 DIAGNOSIS — R531 Weakness: Secondary | ICD-10-CM

## 2019-04-28 LAB — URINALYSIS, COMPLETE (UACMP) WITH MICROSCOPIC
Bilirubin Urine: NEGATIVE
Glucose, UA: 150 mg/dL — AB
Hgb urine dipstick: NEGATIVE
Ketones, ur: NEGATIVE mg/dL
Leukocytes,Ua: NEGATIVE
Nitrite: NEGATIVE
Protein, ur: >=300 mg/dL — AB
Specific Gravity, Urine: 1.014 (ref 1.005–1.030)
pH: 6 (ref 5.0–8.0)

## 2019-04-28 LAB — RESPIRATORY PANEL BY RT PCR (FLU A&B, COVID)
Influenza A by PCR: NEGATIVE
Influenza B by PCR: NEGATIVE
SARS Coronavirus 2 by RT PCR: NEGATIVE

## 2019-04-28 LAB — CBC
HCT: 31.9 % — ABNORMAL LOW (ref 36.0–46.0)
Hemoglobin: 10.5 g/dL — ABNORMAL LOW (ref 12.0–15.0)
MCH: 29.1 pg (ref 26.0–34.0)
MCHC: 32.9 g/dL (ref 30.0–36.0)
MCV: 88.4 fL (ref 80.0–100.0)
Platelets: 305 10*3/uL (ref 150–400)
RBC: 3.61 MIL/uL — ABNORMAL LOW (ref 3.87–5.11)
RDW: 13.4 % (ref 11.5–15.5)
WBC: 10.5 10*3/uL (ref 4.0–10.5)
nRBC: 0 % (ref 0.0–0.2)

## 2019-04-28 LAB — BASIC METABOLIC PANEL
Anion gap: 10 (ref 5–15)
BUN: 46 mg/dL — ABNORMAL HIGH (ref 6–20)
CO2: 18 mmol/L — ABNORMAL LOW (ref 22–32)
Calcium: 8.9 mg/dL (ref 8.9–10.3)
Chloride: 113 mmol/L — ABNORMAL HIGH (ref 98–111)
Creatinine, Ser: 5.26 mg/dL — ABNORMAL HIGH (ref 0.44–1.00)
GFR calc Af Amer: 12 mL/min — ABNORMAL LOW (ref >60)
GFR calc non Af Amer: 10 mL/min — ABNORMAL LOW (ref >60)
Glucose, Bld: 161 mg/dL — ABNORMAL HIGH (ref 70–99)
Potassium: 4.3 mmol/L (ref 3.5–5.1)
Sodium: 141 mmol/L (ref 135–145)

## 2019-04-28 LAB — POCT PREGNANCY, URINE: Preg Test, Ur: NEGATIVE

## 2019-04-28 MED ORDER — METOPROLOL TARTRATE 5 MG/5ML IV SOLN
5.0000 mg | Freq: Once | INTRAVENOUS | Status: AC
  Administered 2019-04-28: 5 mg via INTRAVENOUS
  Filled 2019-04-28: qty 5

## 2019-04-28 NOTE — ED Notes (Signed)
Pt reports drinks occassionally; smokes daily; denies taking any substances. A&Ox4. Denies HA. Denies history of Afib or other irregular cardiac rhythm. Reports history of HTN and was recently looking at needing to go on dialysis. Pt states N/V for a few months. Pt reports boyfriend told her her speech was slurred at one point yesterday. Pt's speech currently clear.

## 2019-04-28 NOTE — ED Notes (Signed)
EDP Archie Balboa to bedside.

## 2019-04-28 NOTE — ED Triage Notes (Signed)
Pt comes into the ED via EMS from home with c/o right sided weakness since 2pm yesterday, CBG245,165/101, HR 103, 100%RA.

## 2019-04-28 NOTE — ED Notes (Signed)
Pt given drink, tv adjusted for pt, given two more warm blankets. Bed locked low. Rail up. Call bell within reach. Pt denies any other needs currently.

## 2019-04-28 NOTE — ED Notes (Addendum)
EDP Joyce West notified pt's BP remains elevated. States will place orders to address it soon. Pt leaving for MRI.

## 2019-04-28 NOTE — ED Notes (Signed)
EDP Joyce West aware pt's BP remains elevated at 200/100s. States wants to maintain pressure in this range but if goes above 230/130 to check back in with him.

## 2019-04-28 NOTE — ED Notes (Signed)
Will give metoprolol once pt back to room.

## 2019-04-28 NOTE — ED Notes (Signed)
Secretary notified pt's covid test negative.

## 2019-04-28 NOTE — ED Notes (Signed)
Pt answering MRI screening questions using this RN's ascom. Pt has food tray at bedside that family brought.

## 2019-04-28 NOTE — ED Notes (Signed)
Pt given update on estimated time of transport. Lights adjusted for pt. Pt given food tray and drink with verbal okay from [place].

## 2019-04-28 NOTE — ED Triage Notes (Signed)
Patient reports weakness in right side since yesterday. States it "feels numb" but she has good sensation. Patient states she has had unsteady gait since yesterday. Reports that she noticed some speech changes as well. Patient alert and oriented x4.

## 2019-04-28 NOTE — ED Provider Notes (Signed)
[facility] Emergency Department Provider Note   ____________________________________________   I have reviewed the triage vital signs and the nursing notes.   HISTORY  Chief Complaint Weakness   History limited by: Not Limited   HPI Joyce West is a 29 y.o. female who presents to the emergency department today because of concerns for right sided weakness.  Patient states that the weakness started around 2:00 yesterday.  She noticed weakness in both her right upper and lower extremity.  She has noticed some discoordination of her right upper extremity.  Additionally she is found hard to walk.  She denies any pain.  Denies any headache.  Patient does have a history of recent ER visit with diagnosis of acute kidney injury.  Chart review is slightly confusing as to what happened as there is an H&P by the hospitalist.  Patient states however that they contacted nephrology and she was able to see nephrology that day so she was not in fact admitted to the hospital.  Records reviewed. Per medical record review patient has a history of DM, HTN  Past Medical History:  Diagnosis Date  . Diabetes mellitus without complication ([place])   . Hypertension     Patient Active Problem List   Diagnosis Date Noted  . AKI (acute kidney injury) ([place]) 04/22/2019  . Hypertension   . Type 2 diabetes mellitus with stage 3 chronic kidney disease ([place])     Past Surgical History:  Procedure Laterality Date  . CESAREAN SECTION     x2  . TUBAL LIGATION      Prior to Admission medications   Medication Sig Start Date End Date Taking? Authorizing Provider  amLODipine (NORVASC) 5 MG tablet Take 10 mg by mouth daily.    [provider]  hydrALAZINE (APRESOLINE) 25 MG tablet Take 25 mg by mouth 4 (four) times daily.     [provider]  liraglutide (VICTOZA) 18 MG/3ML SOPN Inject 1.8 mg into the skin daily.     [provider]  traMADol (ULTRAM) 50 MG  tablet Take 1 tablet (50 mg total) by mouth every 6 (six) hours as needed. 04/22/19   Caryn Section Linden Dolin, PA-C    Allergies Lisinopril  No family history on file.  Social History Social History   Tobacco Use  . Smoking status: Never Smoker  . Smokeless tobacco: Never Used  Substance Use Topics  . Alcohol use: Never  . Drug use: Never    Review of Systems Constitutional: No fever/chills Eyes: No visual changes. ENT: No sore throat. Cardiovascular: Denies chest pain. Respiratory: Denies shortness of breath. Gastrointestinal: No abdominal pain.  No nausea, no vomiting.  No diarrhea.   Genitourinary: Negative for dysuria. Musculoskeletal: Negative for back pain. Skin: Negative for rash. Neurological: Positive for right sided weakness.  ____________________________________________   PHYSICAL EXAM:  VITAL SIGNS: ED Triage Vitals  Enc Vitals Group     BP 04/28/19 1434 (!) 180/126     Pulse Rate 04/28/19 1434 (!) 108     Resp 04/28/19 1434 20     Temp 04/28/19 1434 98.6 F (37 C)     Temp Source 04/28/19 1434 Oral     SpO2 04/28/19 1434 100 %     Weight 04/28/19 1434 172 lb (78 kg)     Height 04/28/19 1434 5\' 4"  (1.626 m)     Head Circumference --      Peak Flow --      Pain Score 04/28/19 1457 0  Constitutional: Alert and oriented.  Eyes: Conjunctivae are normal.  ENT      Head: Normocephalic and atraumatic.      Nose: No congestion/rhinnorhea.      Mouth/Throat: Mucous membranes are moist.      Neck: No stridor. Hematological/Lymphatic/Immunilogical: No cervical lymphadenopathy. Cardiovascular: Normal rate, regular rhythm.  No murmurs, rubs, or gallops.  Respiratory: Normal respiratory effort without tachypnea nor retractions. Breath sounds are clear and equal bilaterally. No wheezes/rales/rhonchi. Gastrointestinal: Soft and non tender. No rebound. No guarding.  Genitourinary: Deferred Musculoskeletal: Normal range of motion in all extremities. No lower extremity  edema. Neurologic:  Normal speech and language. PERRL. EOMI. Strength 4/5 in right upper and lower extremities. Strength 5/5 in left extremities. Sensation intact.  Skin:  Skin is warm, dry and intact. No rash noted. Psychiatric: Mood and affect are normal. Speech and behavior are normal. Patient exhibits appropriate insight and judgment.  ____________________________________________    LABS (pertinent positives/negatives)  Upreg negative UA hazy, glu 150, protein >300, 0-5 rbc and wbc, many bacteria BMP na 141, k 4.3, cl 113, glu 161, cr 5.26 CBC wbc 10.5, hgb 10.5, plt 305   ____________________________________________    RADIOLOGY  CT head Negative non contrast CT head  MRI brain Multiple acute stroke ____________________________________________   PROCEDURES  Procedures  ____________________________________________   INITIAL IMPRESSION / ASSESSMENT AND PLAN / ED COURSE  Pertinent labs & imaging results that were available during my care of the patient were reviewed by me and considered in my medical decision making (see chart for details).   Patient presented to the emergency department today because of concerns for right sided weakness that started yesterday at 2 PM.  On my exam patient does have noticeable weakness on her right upper and lower extremity.  She also has some discoordination of the right upper extremity.  Head CT was negative.  However given patient's risk factors did obtain MRI which was consistent with acute strokes.  I discussed this finding with the patient.  Did discuss importance of admission.  Patient requested transfer to [place] was willing to take patient in transfer.  In terms of the patient's blood pressure it is elevated here.  I discussed with neurologist at [facility].  Stated goal was to have it lower than roughly 220/120.  This allows for some permissive hypertension.  Discussed this with the patient as well. Patient without any headache or  chest pain.   ____________________________________________   FINAL CLINICAL IMPRESSION(S) / ED DIAGNOSES  Final diagnoses:  Right sided weakness  Acute CVA (cerebrovascular accident) [facility])  Secondary hypertension     Note: This dictation was prepared with Dragon dictation. Any transcriptional errors that result from this process are unintentional     Nance Pear, MD 04/28/19 2240

## 2019-04-28 NOTE — ED Notes (Signed)
Metoprolol given through new L fa IV as pt reported significant discomfort from flushing R wrist IV.

## 2019-04-28 NOTE — ED Notes (Signed)
Pt remains off unit at MRI

## 2019-04-29 DIAGNOSIS — I639 Cerebral infarction, unspecified: Secondary | ICD-10-CM | POA: Insufficient documentation

## 2019-04-30 MED ORDER — FAMOTIDINE 20 MG PO TABS
10.00 | ORAL_TABLET | ORAL | Status: DC
Start: 2019-05-01 — End: 2019-04-30

## 2019-04-30 MED ORDER — MELATONIN 3 MG PO TABS
3.00 | ORAL_TABLET | ORAL | Status: DC
Start: ? — End: 2019-04-30

## 2019-04-30 MED ORDER — ACETAMINOPHEN 500 MG PO TABS
1000.00 | ORAL_TABLET | ORAL | Status: DC
Start: ? — End: 2019-04-30

## 2019-04-30 MED ORDER — CETIRIZINE HCL 10 MG PO TABS
5.00 | ORAL_TABLET | ORAL | Status: DC
Start: 2019-05-01 — End: 2019-04-30

## 2019-04-30 MED ORDER — HEPARIN SODIUM (PORCINE) 5000 UNIT/ML IJ SOLN
5000.00 | INTRAMUSCULAR | Status: DC
Start: 2019-04-30 — End: 2019-04-30

## 2019-04-30 MED ORDER — TRAMADOL HCL 50 MG PO TABS
50.00 | ORAL_TABLET | ORAL | Status: DC
Start: ? — End: 2019-04-30

## 2019-04-30 MED ORDER — INSULIN LISPRO 100 UNIT/ML ~~LOC~~ SOLN
1.00 | SUBCUTANEOUS | Status: DC
Start: 2019-04-30 — End: 2019-04-30

## 2019-04-30 MED ORDER — AMLODIPINE BESYLATE 10 MG PO TABS
10.00 | ORAL_TABLET | ORAL | Status: DC
Start: 2019-05-01 — End: 2019-04-30

## 2019-04-30 MED ORDER — MENTHOL 9.1 MG MT LOZG
1.00 | LOZENGE | OROMUCOSAL | Status: DC
Start: ? — End: 2019-04-30

## 2019-04-30 MED ORDER — GENERIC EXTERNAL MEDICATION
Status: DC
Start: ? — End: 2019-04-30

## 2019-04-30 MED ORDER — GENERIC EXTERNAL MEDICATION
75.00 | Status: DC
Start: 2019-04-30 — End: 2019-04-30

## 2019-04-30 MED ORDER — ATORVASTATIN CALCIUM 80 MG PO TABS
80.00 | ORAL_TABLET | ORAL | Status: DC
Start: 2019-04-30 — End: 2019-04-30

## 2019-05-22 ENCOUNTER — Ambulatory Visit: Admission: RE | Admit: 2019-05-22 | Payer: Medicaid Other | Source: Ambulatory Visit

## 2019-05-28 ENCOUNTER — Other Ambulatory Visit: Payer: Self-pay | Admitting: Nephrology

## 2019-05-28 ENCOUNTER — Ambulatory Visit
Admission: RE | Admit: 2019-05-28 | Discharge: 2019-05-28 | Disposition: A | Discharge status: Home / Self Care | Payer: Medicaid Other | Attending: Nephrology | Admitting: Nephrology

## 2019-05-28 ENCOUNTER — Ambulatory Visit
Admission: RE | Admit: 2019-05-28 | Discharge: 2019-05-28 | Disposition: A | Discharge status: Home / Self Care | Payer: Medicaid Other | Source: Ambulatory Visit | Attending: Nephrology | Admitting: Nephrology

## 2019-05-28 ENCOUNTER — Other Ambulatory Visit: Payer: Self-pay

## 2019-05-28 DIAGNOSIS — I10 Essential (primary) hypertension: Secondary | ICD-10-CM | POA: Diagnosis present

## 2019-05-28 DIAGNOSIS — N185 Chronic kidney disease, stage 5: Secondary | ICD-10-CM | POA: Diagnosis not present

## 2019-05-29 ENCOUNTER — Other Ambulatory Visit
Admission: RE | Admit: 2019-05-29 | Discharge: 2019-05-29 | Disposition: A | Discharge status: Home / Self Care | Payer: Medicaid Other | Source: Ambulatory Visit | Attending: Nephrology | Admitting: Nephrology

## 2019-05-29 ENCOUNTER — Other Ambulatory Visit: Payer: Self-pay | Admitting: Nephrology

## 2019-05-29 ENCOUNTER — Encounter
Admission: RE | Disposition: A | Discharge status: Home / Self Care | Payer: Self-pay | Source: Ambulatory Visit | Attending: Vascular Surgery

## 2019-05-29 ENCOUNTER — Encounter: Payer: Self-pay | Admitting: Vascular Surgery

## 2019-05-29 ENCOUNTER — Ambulatory Visit
Admission: RE | Admit: 2019-05-29 | Discharge: 2019-05-29 | Disposition: A | Discharge status: Home / Self Care | Payer: Medicaid Other | Source: Ambulatory Visit | Attending: Vascular Surgery | Admitting: Vascular Surgery

## 2019-05-29 ENCOUNTER — Other Ambulatory Visit (INDEPENDENT_AMBULATORY_CARE_PROVIDER_SITE_OTHER): Payer: Self-pay | Admitting: Vascular Surgery

## 2019-05-29 ENCOUNTER — Other Ambulatory Visit: Payer: Self-pay

## 2019-05-29 DIAGNOSIS — E11319 Type 2 diabetes mellitus with unspecified diabetic retinopathy without macular edema: Secondary | ICD-10-CM | POA: Diagnosis not present

## 2019-05-29 DIAGNOSIS — N2581 Secondary hyperparathyroidism of renal origin: Secondary | ICD-10-CM | POA: Insufficient documentation

## 2019-05-29 DIAGNOSIS — D631 Anemia in chronic kidney disease: Secondary | ICD-10-CM | POA: Insufficient documentation

## 2019-05-29 DIAGNOSIS — Z20822 Contact with and (suspected) exposure to covid-19: Secondary | ICD-10-CM | POA: Insufficient documentation

## 2019-05-29 DIAGNOSIS — R809 Proteinuria, unspecified: Secondary | ICD-10-CM | POA: Insufficient documentation

## 2019-05-29 DIAGNOSIS — I120 Hypertensive chronic kidney disease with stage 5 chronic kidney disease or end stage renal disease: Secondary | ICD-10-CM | POA: Insufficient documentation

## 2019-05-29 DIAGNOSIS — N186 End stage renal disease: Secondary | ICD-10-CM | POA: Insufficient documentation

## 2019-05-29 DIAGNOSIS — N185 Chronic kidney disease, stage 5: Secondary | ICD-10-CM

## 2019-05-29 DIAGNOSIS — F17210 Nicotine dependence, cigarettes, uncomplicated: Secondary | ICD-10-CM | POA: Diagnosis not present

## 2019-05-29 DIAGNOSIS — N179 Acute kidney failure, unspecified: Secondary | ICD-10-CM

## 2019-05-29 DIAGNOSIS — E1122 Type 2 diabetes mellitus with diabetic chronic kidney disease: Secondary | ICD-10-CM | POA: Insufficient documentation

## 2019-05-29 HISTORY — DX: Weakness: R53.1

## 2019-05-29 HISTORY — PX: DIALYSIS/PERMA CATHETER INSERTION: CATH118288

## 2019-05-29 HISTORY — DX: Cerebral infarction, unspecified: I63.9

## 2019-05-29 LAB — POTASSIUM (ARMC VASCULAR LAB ONLY): Potassium (ARMC vascular lab): 3.9 (ref 3.5–5.1)

## 2019-05-29 LAB — GLUCOSE, CAPILLARY
Glucose-Capillary: 120 mg/dL — ABNORMAL HIGH (ref 70–99)
Glucose-Capillary: 138 mg/dL — ABNORMAL HIGH (ref 70–99)

## 2019-05-29 LAB — SARS CORONAVIRUS 2 BY RT PCR (HOSPITAL ORDER, PERFORMED IN ~~LOC~~ HOSPITAL LAB): SARS Coronavirus 2: NEGATIVE

## 2019-05-29 SURGERY — DIALYSIS/PERMA CATHETER INSERTION
Anesthesia: Choice

## 2019-05-29 MED ORDER — MIDAZOLAM HCL 5 MG/5ML IJ SOLN
INTRAMUSCULAR | Status: AC
  Filled 2019-05-29: qty 5

## 2019-05-29 MED ORDER — HEPARIN SODIUM (PORCINE) 5000 UNIT/ML IJ SOLN
INTRAMUSCULAR | Status: AC
  Filled 2019-05-29: qty 2

## 2019-05-29 MED ORDER — FENTANYL CITRATE (PF) 100 MCG/2ML IJ SOLN
INTRAMUSCULAR | Status: AC
  Filled 2019-05-29: qty 2

## 2019-05-29 MED ORDER — CEFAZOLIN SODIUM-DEXTROSE 1-4 GM/50ML-% IV SOLN
INTRAVENOUS | Status: AC
  Administered 2019-05-29: 1 g via INTRAVENOUS
  Filled 2019-05-29: qty 50

## 2019-05-29 MED ORDER — ONDANSETRON HCL 4 MG/2ML IJ SOLN: 4.0000 mg | Freq: Four times a day (QID) | INTRAMUSCULAR | Status: DC | PRN

## 2019-05-29 MED ORDER — MIDAZOLAM HCL 2 MG/ML PO SYRP: 8.0000 mg | ORAL_SOLUTION | Freq: Once | ORAL | Status: DC | PRN

## 2019-05-29 MED ORDER — METHYLPREDNISOLONE SODIUM SUCC 125 MG IJ SOLR: 125.0000 mg | Freq: Once | INTRAMUSCULAR | Status: DC | PRN

## 2019-05-29 MED ORDER — FENTANYL CITRATE (PF) 100 MCG/2ML IJ SOLN
INTRAMUSCULAR | Status: DC | PRN
  Administered 2019-05-29: 50 ug via INTRAVENOUS

## 2019-05-29 MED ORDER — DIPHENHYDRAMINE HCL 50 MG/ML IJ SOLN: 50.0000 mg | Freq: Once | INTRAMUSCULAR | Status: DC | PRN

## 2019-05-29 MED ORDER — HYDROMORPHONE HCL 1 MG/ML IJ SOLN: 1.0000 mg | Freq: Once | INTRAMUSCULAR | Status: DC | PRN

## 2019-05-29 MED ORDER — MIDAZOLAM HCL 2 MG/2ML IJ SOLN
INTRAMUSCULAR | Status: DC | PRN
  Administered 2019-05-29: 2 mg via INTRAVENOUS

## 2019-05-29 MED ORDER — SODIUM CHLORIDE 0.9 % IV SOLN: INTRAVENOUS | Status: DC

## 2019-05-29 MED ORDER — HEPARIN (PORCINE) 25000 UT/250ML-% IV SOLN
INTRAVENOUS | Status: AC
  Filled 2019-05-29: qty 500

## 2019-05-29 MED ORDER — CEFAZOLIN SODIUM-DEXTROSE 1-4 GM/50ML-% IV SOLN: 1.0000 g | Freq: Once | INTRAVENOUS | Status: AC

## 2019-05-29 MED ORDER — FAMOTIDINE 20 MG PO TABS: 40.0000 mg | ORAL_TABLET | Freq: Once | ORAL | Status: DC | PRN

## 2019-05-29 SURGICAL SUPPLY — 8 items
CATH CANNON HEMO 15FR 19 (HEMODIALYSIS SUPPLIES) ×3 IMPLANT
COVER PROBE U/S 5X48 (MISCELLANEOUS) ×3 IMPLANT
DERMABOND ADVANCED (GAUZE/BANDAGES/DRESSINGS) ×2
DERMABOND ADVANCED .7 DNX12 (GAUZE/BANDAGES/DRESSINGS) ×1 IMPLANT
PACK ANGIOGRAPHY (CUSTOM PROCEDURE TRAY) ×3 IMPLANT
SUT MNCRL 4-0 (SUTURE) ×2
SUT MNCRL 4-0 27XMFL (SUTURE) ×1
SUTURE MNCRL 4-0 27XMF (SUTURE) ×1 IMPLANT

## 2019-05-29 NOTE — OR Nursing (Signed)
Left message with [facility] to call pt with follow up visit in one month. They will have to call pt on Monday with apt.

## 2019-05-29 NOTE — Discharge Instructions (Signed)
Tunneled Catheter Insertion, Care After This sheet gives you information about how to care for yourself after your procedure. Your health care provider may also give you more specific instructions. If you have problems or questions, contact your health care provider. What can I expect after the procedure? After the procedure, it is common to have:  Some mild redness, bruising, swelling, and pain around your catheter site.  A small amount of blood or clear fluid coming from your incisions. Follow these instructions at home: Incision care   Follow instructions from your health care provider about how to take care of your incisions. Make sure you: ? Wash your hands with soap and water before and after you change your bandages (dressings). If soap and water are not available, use hand sanitizer. ? Change your dressings as told by your health care provider. Wash the area around your incisions with a germ-killing (antiseptic) solution when you change your dressings. ? Leave stitches (sutures), skin glue, or adhesive strips in place. These skin closures may need to stay in place for 2 weeks or longer. If adhesive strip edges start to loosen and curl up, you may trim the loose edges. Do not remove adhesive strips completely unless your health care provider tells you to do that.  Keep your dressings clean and dry.  Check your incision areas every day for signs of infection. Check for: ? More redness, swelling, or pain. ? More fluid or blood. ? Warmth. ? Pus or a bad smell. Catheter care   Wash your hands with soap and water before and after caring for your catheter. If soap and water are not available, use hand sanitizer.  Keep your catheter site clean and dry.  Apply an antibiotic ointment to your catheter site as told by your health care provider.  Flush your catheter as told by your health care provider. This helps prevent it from becoming clogged.  Do not open the caps on the ends of  the catheter.  Do not pull on your catheter. Medicines  Take over-the-counter and prescription medicines only as told by your health care provider.  If you were prescribed an antibiotic medicine, take it as told by your health care provider. Do not stop taking the antibiotic even if you start to feel better. Activity  Return to your normal activities as told by your health care provider. Ask your health care provider what activities are safe for you.  Follow any other activity restrictions as instructed by your health care provider.  Do not lift anything that is heavier than 10 lb (4.5 kg), or the limit that you are told, until your health care provider says that it is safe. Driving  Do not drive until your health care provider approves.  Ask your health care provider if the medicine prescribed to you requires you to avoid driving or using heavy machinery. General instructions  Follow your health care provider's specific instructions for the type of catheter that you have.  Do not take baths, swim, or use a hot tub until your health care provider approves. Ask your health care provider if you may take showers.  Keep all follow-up visits as told by your health care provider. This is important. Contact a health care provider if:  You feel unusually weak or nauseous.  You have more redness, swelling, or pain at your incisions or around the area where your catheter is inserted.  Your catheter is not working properly.  You are unable to flush your catheter.   Get help right away if:  Your catheter develops a hole or it breaks.  You have pain or swelling when fluids or medicines are being given through the catheter.  Fluid is leaking from the catheter, under the dressing, or around the dressing.  Your catheter comes loose or gets pulled completely out. If this happens, press on your catheter site firmly with a clean cloth until you can get medical help.  You have swelling in  your shoulder, neck, chest, or face.  You have chest pain or difficulty breathing.  You feel dizzy or light-headed.  You have pus or a bad smell coming from your catheter site.  You have a fever or chills.  Your catheter site feels warm to the touch.  You develop bleeding from your catheter or your insertion site, and your bleeding does not stop. Summary  After the procedure, it is common to have mild redness, swelling, and pain around your catheter site.  Return to your normal activities as told by your health care provider. Ask your health care provider what activities are safe for you.  Follow your health care provider's specific instructions for the type of catheter that you have.  Keep your catheter site and your dressings clean and dry.  Contact a health care provider if your catheter is not working properly. Get help right away if you have chest pain, fever, or difficulty breathing. This information is not intended to replace advice given to you by your health care provider. Make sure you discuss any questions you have with your health care provider. Document Revised: 12/17/2017 Document Reviewed: 12/17/2017 Elsevier Patient Education  2020 [employer]  

## 2019-05-29 NOTE — H&P (Signed)
[facility] History & Physical Update  The patient was interviewed and re-examined.  The patient's previous History and Physical has been reviewed and is unchanged.  There is no change in the plan of care. We plan to proceed with the scheduled procedure.  Leotis Pain, MD  05/29/2019, 3:59 PM

## 2019-05-29 NOTE — Op Note (Signed)
OPERATIVE NOTE    PRE-OPERATIVE DIAGNOSIS: 1. ESRD   POST-OPERATIVE DIAGNOSIS: same as above  PROCEDURE: 1. Ultrasound guidance for vascular access to the right internal jugular vein 2. Fluoroscopic guidance for placement of catheter 3. Placement of a 19 cm tip to cuff tunneled hemodialysis catheter via the right internal jugular vein  SURGEON: Leotis Pain, MD  ANESTHESIA:  Local with Moderate conscious sedation for approximately 15 minutes using 2 mg of Versed and 50 mcg of Fentanyl  ESTIMATED BLOOD LOSS: 3 cc  FLUORO TIME: less than one minute  CONTRAST: none  FINDING(S): 1.  Patent right internal jugular vein  SPECIMEN(S):  None  INDICATIONS:   Joyce West is a 29 y.o.female who presents with renal failure.  The patient needs long term dialysis access for their ESRD, and a Permcath is necessary.  Risks and benefits are discussed and informed consent is obtained.    DESCRIPTION: After obtaining full informed written consent, the patient was brought back to the vascular suited. The patient's right neck and chest were sterilely prepped and draped in a sterile surgical field was created. Moderate conscious sedation was administered during a face to face encounter with the patient throughout the procedure with my supervision of the RN administering medicines and monitoring the patient's vital signs, pulse oximetry, telemetry and mental status throughout from the start of the procedure until the patient was taken to the recovery room.  The right internal jugular vein was visualized with ultrasound and found to be patent. It was then accessed under direct ultrasound guidance and a permanent image was recorded. A wire was placed. After skin nick and dilatation, the peel-away sheath was placed over the wire. I then turned my attention to an area under the clavicle. Approximately 1-2 fingerbreadths below the clavicle a small counterincision was created and tunneled from the subclavicular  incision to the access site. Using fluoroscopic guidance, a 19 centimeter tip to cuff tunneled hemodialysis catheter was selected, and tunneled from the subclavicular incision to the access site. It was then placed through the peel-away sheath and the peel-away sheath was removed. Using fluoroscopic guidance the catheter tips were parked in the right atrium. The appropriate distal connectors were placed. It withdrew blood well and flushed easily with heparinized saline and a concentrated heparin solution was then placed. It was secured to the chest wall with 2 Prolene sutures. The access incision was closed single 4-0 Monocryl. A 4-0 Monocryl pursestring suture was placed around the exit site. Sterile dressings were placed. The patient tolerated the procedure well and was taken to the recovery room in stable condition.  COMPLICATIONS: None  CONDITION: Stable  Leotis Pain, MD 05/29/2019 4:42 PM   This note was created with Dragon Medical transcription system. Any errors in dictation are purely unintentional.

## 2019-06-01 ENCOUNTER — Encounter: Payer: Self-pay | Admitting: Cardiology

## 2019-06-24 ENCOUNTER — Telehealth (INDEPENDENT_AMBULATORY_CARE_PROVIDER_SITE_OTHER): Payer: Self-pay

## 2019-06-24 NOTE — Telephone Encounter (Addendum)
Patient called stating that last night her daughter had pulled her perm-a-cath out some and her boyfriend informed her that she had some blood and fluid around the area. The patient contacted the hospital and was advise to contact our office. The patient informed that the cath is still covered from yesterday after dialysis but the perm a cath is still getting attach to items,the stitches are loose detach at the bottom,and top of the cath looks like sticking out some. The patient informed that she is suppose to go to dialysis tomorrow but she has an appointment,so dialysis informed her to contact them on Friday for dialysis.I spoke with Eulogio Ditch NP and advise for the patient to cover the area and the dialysis center will reach out to the office there any concerns while doing her dialysis.

## 2019-06-25 DIAGNOSIS — Z4902 Encounter for fitting and adjustment of peritoneal dialysis catheter: Secondary | ICD-10-CM | POA: Insufficient documentation

## 2019-07-03 ENCOUNTER — Ambulatory Visit (INDEPENDENT_AMBULATORY_CARE_PROVIDER_SITE_OTHER): Payer: Self-pay | Admitting: Nurse Practitioner

## 2019-07-03 ENCOUNTER — Other Ambulatory Visit (INDEPENDENT_AMBULATORY_CARE_PROVIDER_SITE_OTHER): Payer: Self-pay | Admitting: Vascular Surgery

## 2019-07-03 ENCOUNTER — Other Ambulatory Visit (INDEPENDENT_AMBULATORY_CARE_PROVIDER_SITE_OTHER): Payer: Self-pay

## 2019-07-03 ENCOUNTER — Encounter (INDEPENDENT_AMBULATORY_CARE_PROVIDER_SITE_OTHER): Payer: Self-pay

## 2019-07-03 DIAGNOSIS — N185 Chronic kidney disease, stage 5: Secondary | ICD-10-CM

## 2019-07-06 ENCOUNTER — Emergency Department
Admission: EM | Admit: 2019-07-06 | Discharge: 2019-07-06 | Discharge status: Against Medical Advice | Payer: Medicaid Other

## 2019-07-06 NOTE — ED Triage Notes (Signed)
Pt called from [facility], no response

## 2019-07-06 NOTE — ED Notes (Signed)
Called 3 times today

## 2019-07-06 NOTE — ED Triage Notes (Signed)
Pt called from [place] no response

## 2019-07-06 NOTE — ED Notes (Signed)
Pt called x's 3, no response ?

## 2019-07-07 ENCOUNTER — Emergency Department
Admission: EM | Admit: 2019-07-07 | Discharge: 2019-07-07 | Disposition: A | Discharge status: Home / Self Care | Payer: Medicaid Other | Attending: Emergency Medicine | Admitting: Emergency Medicine

## 2019-07-07 ENCOUNTER — Other Ambulatory Visit: Payer: Self-pay

## 2019-07-07 ENCOUNTER — Emergency Department: Payer: Medicaid Other

## 2019-07-07 DIAGNOSIS — Z5321 Procedure and treatment not carried out due to patient leaving prior to being seen by health care provider: Secondary | ICD-10-CM | POA: Diagnosis not present

## 2019-07-07 DIAGNOSIS — R222 Localized swelling, mass and lump, trunk: Secondary | ICD-10-CM | POA: Insufficient documentation

## 2019-07-07 DIAGNOSIS — R112 Nausea with vomiting, unspecified: Secondary | ICD-10-CM | POA: Insufficient documentation

## 2019-07-07 DIAGNOSIS — R509 Fever, unspecified: Secondary | ICD-10-CM | POA: Insufficient documentation

## 2019-07-07 MED ORDER — OXYCODONE HCL 5 MG PO TABS: 5.0000 mg | ORAL_TABLET | Freq: Once | ORAL | Status: DC

## 2019-07-07 MED ORDER — OXYCODONE HCL 5 MG PO TABS
ORAL_TABLET | ORAL | Status: AC
  Filled 2019-07-07: qty 1

## 2019-07-07 NOTE — ED Triage Notes (Signed)
Pt is a t/th/sat dialysis, reports she has missed several dialysis treatments, her last was on 6/17, she is due to see her doctor in the morning to find out when she will have dialysis again.

## 2019-07-07 NOTE — ED Notes (Signed)
Unable to locate pt at this time. Assumed pt has left the ED.

## 2019-07-07 NOTE — ED Notes (Signed)
Pt called to STAT desk. unable to locate pt at this time.

## 2019-07-07 NOTE — ED Notes (Signed)
Pt was called in lobby area several times and out side for pain med admin without response.

## 2019-07-07 NOTE — ED Triage Notes (Addendum)
Pt c/o pain and swelling at site of dialysis catheter (to the right chest) around 8pm,  Was placed 1 month ago. Pt states that her 29 year old "yanked" on it "a couple of times, but this is the first time it started swelling". States leaking. Took tylenol without relief.    On assessment, noted that dialysis catheter is hanging out, not currently sutured in place.

## 2019-07-07 NOTE — ED Notes (Signed)
Unable to locate pt at this time

## 2019-10-23 DIAGNOSIS — N186 End stage renal disease: Secondary | ICD-10-CM | POA: Insufficient documentation

## 2019-10-23 DIAGNOSIS — I161 Hypertensive emergency: Secondary | ICD-10-CM | POA: Insufficient documentation

## 2019-10-23 DIAGNOSIS — D631 Anemia in chronic kidney disease: Secondary | ICD-10-CM | POA: Insufficient documentation

## 2019-10-23 DIAGNOSIS — M791 Myalgia, unspecified site: Secondary | ICD-10-CM | POA: Insufficient documentation

## 2019-10-23 DIAGNOSIS — R197 Diarrhea, unspecified: Secondary | ICD-10-CM | POA: Insufficient documentation

## 2019-10-28 ENCOUNTER — Telehealth (INDEPENDENT_AMBULATORY_CARE_PROVIDER_SITE_OTHER): Payer: Self-pay

## 2019-10-28 NOTE — Telephone Encounter (Signed)
Spoke with the patient and she is scheduled with Dr. Lucky Cowboy for a permcath insertion on 11/02/19 with a 9:45 am arrival time to the [facility]. Per the patient she is having a covid test today  (10/28/19) due to having surgery on Friday. Pre-procedure instructions were discussed and will be mailed. I called the patient back due to not having an appt for a covid test or surgery in the system, patient is scheduled at [facility] for surgery and covid. Patient stated she will go 10/29/19 between 8-1 pm to have her covid test taken at the [facility].

## 2019-10-29 ENCOUNTER — Other Ambulatory Visit: Admission: RE | Admit: 2019-10-29 | Payer: Medicare Other | Source: Ambulatory Visit

## 2019-10-30 DIAGNOSIS — R059 Cough, unspecified: Secondary | ICD-10-CM | POA: Insufficient documentation

## 2019-11-01 ENCOUNTER — Other Ambulatory Visit (INDEPENDENT_AMBULATORY_CARE_PROVIDER_SITE_OTHER): Payer: Self-pay | Admitting: Nurse Practitioner

## 2019-11-01 DIAGNOSIS — J189 Pneumonia, unspecified organism: Secondary | ICD-10-CM | POA: Insufficient documentation

## 2019-11-02 ENCOUNTER — Ambulatory Visit
Admission: RE | Admit: 2019-11-02 | Discharge: 2019-11-02 | Disposition: A | Discharge status: Home / Self Care | Payer: Medicare Other | Attending: Vascular Surgery | Admitting: Vascular Surgery

## 2019-11-02 ENCOUNTER — Encounter
Admission: RE | Disposition: A | Discharge status: Home / Self Care | Payer: Self-pay | Source: Home / Self Care | Attending: Vascular Surgery

## 2019-11-02 ENCOUNTER — Encounter: Payer: Self-pay | Admitting: Vascular Surgery

## 2019-11-02 ENCOUNTER — Other Ambulatory Visit: Payer: Self-pay

## 2019-11-02 DIAGNOSIS — E1122 Type 2 diabetes mellitus with diabetic chronic kidney disease: Secondary | ICD-10-CM | POA: Insufficient documentation

## 2019-11-02 DIAGNOSIS — Z992 Dependence on renal dialysis: Secondary | ICD-10-CM | POA: Insufficient documentation

## 2019-11-02 DIAGNOSIS — I120 Hypertensive chronic kidney disease with stage 5 chronic kidney disease or end stage renal disease: Secondary | ICD-10-CM | POA: Diagnosis present

## 2019-11-02 DIAGNOSIS — N185 Chronic kidney disease, stage 5: Secondary | ICD-10-CM

## 2019-11-02 DIAGNOSIS — N186 End stage renal disease: Secondary | ICD-10-CM | POA: Insufficient documentation

## 2019-11-02 HISTORY — PX: DIALYSIS/PERMA CATHETER INSERTION: CATH118288

## 2019-11-02 LAB — GLUCOSE, CAPILLARY
Glucose-Capillary: 202 mg/dL — ABNORMAL HIGH (ref 70–99)
Glucose-Capillary: 175 mg/dL — ABNORMAL HIGH (ref 70–99)

## 2019-11-02 LAB — POTASSIUM (ARMC VASCULAR LAB ONLY): Potassium (ARMC vascular lab): 4.2 (ref 3.5–5.1)

## 2019-11-02 SURGERY — DIALYSIS/PERMA CATHETER INSERTION
Anesthesia: Moderate Sedation

## 2019-11-02 MED ORDER — FAMOTIDINE 20 MG PO TABS: 40.0000 mg | ORAL_TABLET | Freq: Once | ORAL | Status: DC | PRN

## 2019-11-02 MED ORDER — HYDROMORPHONE HCL 1 MG/ML IJ SOLN: 1.0000 mg | Freq: Once | INTRAMUSCULAR | Status: DC | PRN

## 2019-11-02 MED ORDER — CEFAZOLIN SODIUM-DEXTROSE 1-4 GM/50ML-% IV SOLN
1.0000 g | Freq: Once | INTRAVENOUS | Status: AC
  Administered 2019-11-02: 1 g via INTRAVENOUS

## 2019-11-02 MED ORDER — MIDAZOLAM HCL 2 MG/ML PO SYRP: 8.0000 mg | ORAL_SOLUTION | Freq: Once | ORAL | Status: DC | PRN

## 2019-11-02 MED ORDER — SODIUM CHLORIDE 0.9 % IV SOLN
INTRAVENOUS | Status: DC
  Administered 2019-11-02: 50 mL via INTRAVENOUS

## 2019-11-02 MED ORDER — ONDANSETRON HCL 4 MG/2ML IJ SOLN: 4.0000 mg | Freq: Four times a day (QID) | INTRAMUSCULAR | Status: DC | PRN

## 2019-11-02 MED ORDER — HEPARIN SODIUM (PORCINE) 10000 UNIT/ML IJ SOLN
INTRAMUSCULAR | Status: AC
  Filled 2019-11-02: qty 1

## 2019-11-02 MED ORDER — DIPHENHYDRAMINE HCL 50 MG/ML IJ SOLN: 50.0000 mg | Freq: Once | INTRAMUSCULAR | Status: DC | PRN

## 2019-11-02 MED ORDER — MIDAZOLAM HCL 2 MG/2ML IJ SOLN
INTRAMUSCULAR | Status: DC | PRN
  Administered 2019-11-02: 1 mg via INTRAVENOUS
  Administered 2019-11-02: 2 mg via INTRAVENOUS

## 2019-11-02 MED ORDER — FENTANYL CITRATE (PF) 100 MCG/2ML IJ SOLN
INTRAMUSCULAR | Status: DC | PRN
Start: 2019-11-02 — End: 2019-11-02
  Administered 2019-11-02: 25 ug via INTRAVENOUS
  Administered 2019-11-02: 50 ug via INTRAVENOUS

## 2019-11-02 MED ORDER — METHYLPREDNISOLONE SODIUM SUCC 125 MG IJ SOLR: 125.0000 mg | Freq: Once | INTRAMUSCULAR | Status: DC | PRN

## 2019-11-02 MED ORDER — FENTANYL CITRATE (PF) 100 MCG/2ML IJ SOLN
INTRAMUSCULAR | Status: AC
  Filled 2019-11-02: qty 2

## 2019-11-02 MED ORDER — MIDAZOLAM HCL 5 MG/5ML IJ SOLN
INTRAMUSCULAR | Status: AC
  Filled 2019-11-02: qty 5

## 2019-11-02 SURGICAL SUPPLY — 6 items
CATH PALINDROME-P 19CM W/VT (CATHETERS) ×3 IMPLANT
DERMABOND ADVANCED (GAUZE/BANDAGES/DRESSINGS) ×2
DERMABOND ADVANCED .7 DNX12 (GAUZE/BANDAGES/DRESSINGS) ×1 IMPLANT
PACK ANGIOGRAPHY (CUSTOM PROCEDURE TRAY) ×3 IMPLANT
SUT MNCRL AB 4-0 PS2 18 (SUTURE) ×3 IMPLANT
SUT PROLENE 0 CT 1 30 (SUTURE) ×3 IMPLANT

## 2019-11-02 NOTE — Discharge Instructions (Signed)
Tunneled Catheter Insertion, Care After This sheet gives you information about how to care for yourself after your procedure. Your health care provider may also give you more specific instructions. If you have problems or questions, contact your health care provider. What can I expect after the procedure? After the procedure, it is common to have:  Some mild redness, bruising, swelling, and pain around your catheter site.  A small amount of blood or clear fluid coming from your incisions. Follow these instructions at home: Incision care   Follow instructions from your health care provider about how to take care of your incisions. Make sure you: ? Wash your hands with soap and water before and after you change your bandages (dressings). If soap and water are not available, use hand sanitizer. ? Change your dressings as told by your health care provider. Wash the area around your incisions with a germ-killing (antiseptic) solution when you change your dressings. ? Leave stitches (sutures), skin glue, or adhesive strips in place. These skin closures may need to stay in place for 2 weeks or longer. If adhesive strip edges start to loosen and curl up, you may trim the loose edges. Do not remove adhesive strips completely unless your health care provider tells you to do that.  Keep your dressings clean and dry.  Check your incision areas every day for signs of infection. Check for: ? More redness, swelling, or pain. ? More fluid or blood. ? Warmth. ? Pus or a bad smell. Catheter care   Wash your hands with soap and water before and after caring for your catheter. If soap and water are not available, use hand sanitizer.  Keep your catheter site clean and dry.  Apply an antibiotic ointment to your catheter site as told by your health care provider.  Flush your catheter as told by your health care provider. This helps prevent it from becoming clogged.  Do not open the caps on the ends of  the catheter.  Do not pull on your catheter. Medicines  Take over-the-counter and prescription medicines only as told by your health care provider.  If you were prescribed an antibiotic medicine, take it as told by your health care provider. Do not stop taking the antibiotic even if you start to feel better. Activity  Return to your normal activities as told by your health care provider. Ask your health care provider what activities are safe for you.  Follow any other activity restrictions as instructed by your health care provider.  Do not lift anything that is heavier than 10 lb (4.5 kg), or the limit that you are told, until your health care provider says that it is safe. Driving  Do not drive until your health care provider approves.  Ask your health care provider if the medicine prescribed to you requires you to avoid driving or using heavy machinery. General instructions  Follow your health care provider's specific instructions for the type of catheter that you have.  Do not take baths, swim, or use a hot tub until your health care provider approves. Ask your health care provider if you may take showers.  Keep all follow-up visits as told by your health care provider. This is important. Contact a health care provider if:  You feel unusually weak or nauseous.  You have more redness, swelling, or pain at your incisions or around the area where your catheter is inserted.  Your catheter is not working properly.  You are unable to flush your catheter.   Get help right away if:  Your catheter develops a hole or it breaks.  You have pain or swelling when fluids or medicines are being given through the catheter.  Fluid is leaking from the catheter, under the dressing, or around the dressing.  Your catheter comes loose or gets pulled completely out. If this happens, press on your catheter site firmly with a clean cloth until you can get medical help.  You have swelling in  your shoulder, neck, chest, or face.  You have chest pain or difficulty breathing.  You feel dizzy or light-headed.  You have pus or a bad smell coming from your catheter site.  You have a fever or chills.  Your catheter site feels warm to the touch.  You develop bleeding from your catheter or your insertion site, and your bleeding does not stop. Summary  After the procedure, it is common to have mild redness, swelling, and pain around your catheter site.  Return to your normal activities as told by your health care provider. Ask your health care provider what activities are safe for you.  Follow your health care provider's specific instructions for the type of catheter that you have.  Keep your catheter site and your dressings clean and dry.  Contact a health care provider if your catheter is not working properly. Get help right away if you have chest pain, fever, or difficulty breathing. This information is not intended to replace advice given to you by your health care provider. Make sure you discuss any questions you have with your health care provider. Document Revised: 12/17/2017 Document Reviewed: 12/17/2017 Elsevier Patient Education  [place].   Moderate Conscious Sedation, Adult, Care After These instructions provide you with information about caring for yourself after your procedure. Your health care provider may also give you more specific instructions. Your treatment has been planned according to current medical practices, but problems sometimes occur. Call your health care provider if you have any problems or questions after your procedure. What can I expect after the procedure? After your procedure, it is common:  To feel sleepy for several hours.  To feel clumsy and have poor balance for several hours.  To have poor judgment for several hours.  To vomit if you eat too soon. Follow these instructions at home: For at least 24 hours after the  procedure:   Do not: ? Participate in activities where you could fall or become injured. ? Drive. ? Use heavy machinery. ? Drink alcohol. ? Take sleeping pills or medicines that cause drowsiness. ? Make important decisions or sign legal documents. ? Take care of children on your own.  Rest. Eating and drinking  Follow the diet recommended by your health care provider.  If you vomit: ? Drink water, juice, or soup when you can drink without vomiting. ? Make sure you have little or no nausea before eating solid foods. General instructions  Have a responsible adult stay with you until you are awake and alert.  Take over-the-counter and prescription medicines only as told by your health care provider.  If you smoke, do not smoke without supervision.  Keep all follow-up visits as told by your health care provider. This is important. Contact a health care provider if:  You keep feeling nauseous or you keep vomiting.  You feel light-headed.  You develop a rash.  You have a fever. Get help right away if:  You have trouble breathing. This information is not intended to replace advice given  to you by your health care provider. Make sure you discuss any questions you have with your health care provider. Document Revised: 12/07/2016 Document Reviewed: 04/16/2015 Elsevier Patient Education  2020 [employer].

## 2019-11-02 NOTE — Progress Notes (Signed)
Pt given graham crackers and sprite. Boyfriend called to bedside.

## 2019-11-02 NOTE — Op Note (Signed)
OPERATIVE NOTE    PRE-OPERATIVE DIAGNOSIS: 1. ESRD   POST-OPERATIVE DIAGNOSIS: same as above  PROCEDURE: 1. Ultrasound guidance for vascular access to the right internal jugular vein 2. Fluoroscopic guidance for placement of catheter 3. Placement of a 19 cm tip to cuff tunneled hemodialysis catheter via the right internal jugular vein  SURGEON: Leotis Pain, MD  ANESTHESIA:  Local with Moderate conscious sedation for approximately 22 minutes using 3 mg of Versed and 75 mcg of Fentanyl  ESTIMATED BLOOD LOSS: 15 cc  FLUORO TIME: less than one minute  CONTRAST: none  FINDING(S): 1.  Patent right internal jugular vein  SPECIMEN(S):  None  INDICATIONS:   Joyce West is a 29 y.o.female who presents with renal failure.  The patient needs long term dialysis access for their ESRD, and a Permcath is necessary.  Risks and benefits are discussed and informed consent is obtained.    DESCRIPTION: After obtaining full informed written consent, the patient was brought back to the vascular suited. The patient's right neck and chest were sterilely prepped and draped in a sterile surgical field was created. Moderate conscious sedation was administered during a face to face encounter with the patient throughout the procedure with my supervision of the RN administering medicines and monitoring the patient's vital signs, pulse oximetry, telemetry and mental status throughout from the start of the procedure until the patient was taken to the recovery room.  The right internal jugular vein was visualized with ultrasound and found to be patent. It was then accessed under direct ultrasound guidance and a permanent image was recorded. A wire was placed. After skin nick and dilatation, the peel-away sheath was placed over the wire. I then turned my attention to an area under the clavicle. Approximately 1-2 fingerbreadths below the clavicle a small counterincision was created and tunneled from the subclavicular  incision to the access site. Using fluoroscopic guidance, a 19 centimeter tip to cuff tunneled hemodialysis catheter was selected, and tunneled from the subclavicular incision to the access site. It was then placed through the peel-away sheath and the peel-away sheath was removed. Using fluoroscopic guidance the catheter tips were parked in the right atrium. The appropriate distal connectors were placed. It withdrew blood well and flushed easily with heparinized saline and a concentrated heparin solution was then placed. It was secured to the chest wall with 2 Prolene sutures. The access incision was closed single 4-0 Monocryl. A 4-0 Monocryl pursestring suture was placed around the exit site. Sterile dressings were placed. The patient tolerated the procedure well and was taken to the recovery room in stable condition.  COMPLICATIONS: None  CONDITION: Stable  Leotis Pain, MD 11/02/2019 1:38 PM   This note was created with Dragon Medical transcription system. Any errors in dictation are purely unintentional.

## 2019-11-05 NOTE — H&P (Signed)
[facility] Admission History & Physical  MRN : [id number]  Joyce West is a 29 y.o. (Dec 01, 1990) female who presents with chief complaint of No chief complaint on file. Marland Kitchen  History of Present Illness: Patient with advanced renal failure needs permcath for initiation of dialysis.  Has not yet had vein mapping or extremity access placed.  Had a previous PermCath but this was removed.  No new complaints today.  No current facility-administered medications for this encounter.   Current Outpatient Medications  Medication Sig Dispense Refill  . amLODipine (NORVASC) 5 MG tablet Take 10 mg by mouth daily.    Marland Kitchen atorvastatin (LIPITOR) 10 MG tablet Take 10 mg by mouth daily. Patient does not know what does she takes    . hydrALAZINE (APRESOLINE) 25 MG tablet Take 25 mg by mouth 4 (four) times daily.     Marland Kitchen liraglutide (VICTOZA) 18 MG/3ML SOPN Inject 1.8 mg into the skin daily.     . traMADol (ULTRAM) 50 MG tablet Take 1 tablet (50 mg total) by mouth every 6 (six) hours as needed. 15 tablet 0  . aspirin 81 MG chewable tablet Chew by mouth daily. (Patient not taking: Reported on 11/02/2019)      Past Medical History:  Diagnosis Date  . Diabetes mellitus without complication ([place])   . Hypertension   . Ischemic stroke ([place])   . Right sided weakness    secondary to stroke     Past Surgical History:  Procedure Laterality Date  . CESAREAN SECTION     x 3  . DIALYSIS/PERMA CATHETER INSERTION N/A 05/29/2019   Procedure: DIALYSIS/PERMA CATHETER INSERTION;  Surgeon: Algernon Huxley, MD;  Location: [facility];  Service: Cardiovascular;  Laterality: N/A;  . DIALYSIS/PERMA CATHETER INSERTION N/A 11/02/2019   Procedure: DIALYSIS/PERMA CATHETER INSERTION;  Surgeon: Algernon Huxley, MD;  Location: [facility];  Service: Cardiovascular;  Laterality: N/A;  . TUBAL LIGATION       Social History   Tobacco Use  . Smoking status: Former Smoker    Types: Cigarettes   . Smokeless tobacco: Never Used  Substance Use Topics  . Alcohol use: Not Currently  . Drug use: Never     Family History No bleeding or clotting disorders  Allergies  Allergen Reactions  . Enalapril     cough  . Lisinopril Cough     REVIEW OF SYSTEMS (Negative unless checked)  Constitutional: [] Weight loss  [] Fever  [] Chills Cardiac: [] Chest pain   [] Chest pressure   [] Palpitations   [] Shortness of breath when laying flat   [] Shortness of breath at rest   [] Shortness of breath with exertion. Vascular:  [] Pain in legs with walking   [] Pain in legs at rest   [] Pain in legs when laying flat   [] Claudication   [] Pain in feet when walking  [] Pain in feet at rest  [] Pain in feet when laying flat   [] History of DVT   [] Phlebitis   [] Swelling in legs   [] Varicose veins   [] Non-healing ulcers Pulmonary:   [] Uses home oxygen   [] Productive cough   [] Hemoptysis   [] Wheeze  [] COPD   [] Asthma Neurologic:  [] Dizziness  [] Blackouts   [] Seizures   [x] History of stroke   [] History of TIA  [] Aphasia   [] Temporary blindness   [] Dysphagia   [x] Weakness or numbness in arms   [] Weakness or numbness in legs Musculoskeletal:  [] Arthritis   [] Joint swelling   [] Joint pain   [] Low  back pain Hematologic:  [] Easy bruising  [] Easy bleeding   [] Hypercoagulable state   [x] Anemic  [] Hepatitis Gastrointestinal:  [] Blood in stool   [] Vomiting blood  [] Gastroesophageal reflux/heartburn   [] Difficulty swallowing. Genitourinary:  [x] Chronic kidney disease   [] Difficult urination  [] Frequent urination  [] Burning with urination   [] Blood in urine Skin:  [] Rashes   [] Ulcers   [] Wounds Psychological:  [] History of anxiety   []  History of major depression.  Physical Examination  Vitals:   11/02/19 1333 11/02/19 1400 11/02/19 1415 11/02/19 1430  BP: (!) 153/92 (!) 159/102 (!) 150/92 (!) 161/95  Pulse:  98 98 96  Resp: 19 15 15 16   Temp:      TempSrc:      SpO2: 100% 95% 97% 100%  Weight:      Height:       Body  mass index is 29.18 kg/m. Gen: WD/WN, NAD Head: [place]/AT, No temporalis wasting.  Ear/Nose/Throat: Hearing grossly intact, nares w/o erythema or drainage, oropharynx w/o Erythema/Exudate,  Eyes: Conjunctiva clear, sclera non-icteric Neck: Trachea midline.  No JVD.  Pulmonary:  Good air movement, respirations not labored, no use of accessory muscles.  Cardiac: RRR, normal S1, S2. Vascular:  Vessel Right Left  Radial Palpable Palpable                   Musculoskeletal: M/S 5/5 throughout.  Extremities without ischemic changes.  No deformity or atrophy.  Neurologic: Sensation grossly intact in extremities.  Symmetrical.  Speech is fluent. Motor exam as listed above. Psychiatric: Judgment intact, Mood & affect appropriate for pt's clinical situation. Dermatologic: No rashes or ulcers noted.  No cellulitis or open wounds.      CBC Lab Results  Component Value Date   WBC 10.5 04/28/2019   HGB 10.5 (L) 04/28/2019   HCT 31.9 (L) 04/28/2019   MCV 88.4 04/28/2019   PLT 305 04/28/2019    BMET    Component Value Date/Time   NA 141 04/28/2019 1435   NA 136 04/13/2014 2026   K 4.3 04/28/2019 1435   K 3.7 04/13/2014 2026   CL 113 (H) 04/28/2019 1435   CL 103 04/13/2014 2026   CO2 18 (L) 04/28/2019 1435   CO2 26 04/13/2014 2026   GLUCOSE 161 (H) 04/28/2019 1435   GLUCOSE 241 (H) 04/13/2014 2026   BUN 46 (H) 04/28/2019 1435   BUN 15 04/13/2014 2026   CREATININE 5.26 (H) 04/28/2019 1435   CREATININE 0.80 04/13/2014 2026   CALCIUM 8.9 04/28/2019 1435   CALCIUM 9.1 04/13/2014 2026   GFRNONAA 10 (L) 04/28/2019 1435   GFRNONAA >60 04/13/2014 2026   GFRAA 12 (L) 04/28/2019 1435   GFRAA >60 04/13/2014 2026   CrCl cannot be calculated (Patient's most recent lab result is older than the maximum 21 days allowed.).  COAG No results found for: INR, PROTIME  Radiology PERIPHERAL VASCULAR CATHETERIZATION  Result Date: 11/02/2019 See op note    Assessment/Plan 1.  Renal  failure.  Now felt to be end-stage renal disease requiring dialysis.  PermCath will be placed today. 2.  Hypertension.  An underlying cause of her renal failure and blood pressure control important in reducing the progression of atherosclerotic disease. On appropriate oral medications. 3.  Diabetes.  An underlying cause of her renal failure and blood glucose control important in reducing the progression of atherosclerotic disease. Also, involved in wound healing. On appropriate medications.    Leotis Pain, MD  11/05/2019 8:37 AM

## 2019-11-26 DIAGNOSIS — E8770 Fluid overload, unspecified: Secondary | ICD-10-CM | POA: Insufficient documentation

## 2019-11-27 DIAGNOSIS — I319 Disease of pericardium, unspecified: Secondary | ICD-10-CM | POA: Insufficient documentation

## 2019-11-27 DIAGNOSIS — I10 Essential (primary) hypertension: Secondary | ICD-10-CM | POA: Insufficient documentation

## 2019-11-27 DIAGNOSIS — J15212 Pneumonia due to Methicillin resistant Staphylococcus aureus: Secondary | ICD-10-CM | POA: Insufficient documentation

## 2019-11-27 DIAGNOSIS — R7881 Bacteremia: Secondary | ICD-10-CM | POA: Insufficient documentation

## 2019-11-27 DIAGNOSIS — B9562 Methicillin resistant Staphylococcus aureus infection as the cause of diseases classified elsewhere: Secondary | ICD-10-CM | POA: Insufficient documentation

## 2019-12-08 ENCOUNTER — Encounter (INDEPENDENT_AMBULATORY_CARE_PROVIDER_SITE_OTHER): Payer: Medicaid Other

## 2019-12-08 ENCOUNTER — Other Ambulatory Visit (INDEPENDENT_AMBULATORY_CARE_PROVIDER_SITE_OTHER): Payer: Medicaid Other

## 2019-12-08 ENCOUNTER — Ambulatory Visit (INDEPENDENT_AMBULATORY_CARE_PROVIDER_SITE_OTHER): Payer: Medicaid Other | Admitting: Nurse Practitioner

## 2019-12-15 ENCOUNTER — Other Ambulatory Visit (INDEPENDENT_AMBULATORY_CARE_PROVIDER_SITE_OTHER): Payer: Medicaid Other

## 2019-12-15 ENCOUNTER — Ambulatory Visit (INDEPENDENT_AMBULATORY_CARE_PROVIDER_SITE_OTHER): Payer: Medicaid Other | Admitting: Vascular Surgery

## 2019-12-15 ENCOUNTER — Encounter (INDEPENDENT_AMBULATORY_CARE_PROVIDER_SITE_OTHER): Payer: Medicaid Other

## 2019-12-18 ENCOUNTER — Other Ambulatory Visit (INDEPENDENT_AMBULATORY_CARE_PROVIDER_SITE_OTHER): Payer: Medicare Other

## 2019-12-18 ENCOUNTER — Encounter (INDEPENDENT_AMBULATORY_CARE_PROVIDER_SITE_OTHER): Payer: Self-pay

## 2019-12-18 ENCOUNTER — Ambulatory Visit (INDEPENDENT_AMBULATORY_CARE_PROVIDER_SITE_OTHER): Payer: Self-pay | Admitting: Vascular Surgery

## 2019-12-24 ENCOUNTER — Other Ambulatory Visit: Payer: Self-pay

## 2019-12-24 ENCOUNTER — Emergency Department: Payer: Medicare Other

## 2019-12-24 ENCOUNTER — Observation Stay
Admission: EM | Admit: 2019-12-24 | Discharge: 2019-12-25 | Disposition: A | Discharge status: Home / Self Care | Payer: Medicare Other | Attending: Internal Medicine | Admitting: Internal Medicine

## 2019-12-24 DIAGNOSIS — Z79899 Other long term (current) drug therapy: Secondary | ICD-10-CM | POA: Diagnosis not present

## 2019-12-24 DIAGNOSIS — Z20822 Contact with and (suspected) exposure to covid-19: Secondary | ICD-10-CM | POA: Diagnosis not present

## 2019-12-24 DIAGNOSIS — Z7982 Long term (current) use of aspirin: Secondary | ICD-10-CM | POA: Insufficient documentation

## 2019-12-24 DIAGNOSIS — N186 End stage renal disease: Secondary | ICD-10-CM | POA: Diagnosis present

## 2019-12-24 DIAGNOSIS — J15212 Pneumonia due to Methicillin resistant Staphylococcus aureus: Secondary | ICD-10-CM | POA: Diagnosis present

## 2019-12-24 DIAGNOSIS — J9600 Acute respiratory failure, unspecified whether with hypoxia or hypercapnia: Secondary | ICD-10-CM

## 2019-12-24 DIAGNOSIS — E1122 Type 2 diabetes mellitus with diabetic chronic kidney disease: Secondary | ICD-10-CM | POA: Diagnosis not present

## 2019-12-24 DIAGNOSIS — R0602 Shortness of breath: Secondary | ICD-10-CM | POA: Diagnosis not present

## 2019-12-24 DIAGNOSIS — J810 Acute pulmonary edema: Secondary | ICD-10-CM

## 2019-12-24 DIAGNOSIS — Z87891 Personal history of nicotine dependence: Secondary | ICD-10-CM | POA: Insufficient documentation

## 2019-12-24 DIAGNOSIS — I10 Essential (primary) hypertension: Secondary | ICD-10-CM | POA: Diagnosis present

## 2019-12-24 DIAGNOSIS — N183 Chronic kidney disease, stage 3 unspecified: Secondary | ICD-10-CM | POA: Diagnosis present

## 2019-12-24 DIAGNOSIS — M549 Dorsalgia, unspecified: Secondary | ICD-10-CM

## 2019-12-24 DIAGNOSIS — J9601 Acute respiratory failure with hypoxia: Principal | ICD-10-CM | POA: Insufficient documentation

## 2019-12-24 HISTORY — DX: Disorder of kidney and ureter, unspecified: N28.9

## 2019-12-24 LAB — COMPREHENSIVE METABOLIC PANEL
ALT: 17 U/L (ref 0–44)
AST: 24 U/L (ref 15–41)
Albumin: 3.7 g/dL (ref 3.5–5.0)
Alkaline Phosphatase: 110 U/L (ref 38–126)
Anion gap: 16 — ABNORMAL HIGH (ref 5–15)
BUN: 35 mg/dL — ABNORMAL HIGH (ref 6–20)
CO2: 23 mmol/L (ref 22–32)
Calcium: 9.7 mg/dL (ref 8.9–10.3)
Chloride: 100 mmol/L (ref 98–111)
Creatinine, Ser: 10.5 mg/dL — ABNORMAL HIGH (ref 0.44–1.00)
GFR, Estimated: 5 mL/min — ABNORMAL LOW (ref >60)
Glucose, Bld: 137 mg/dL — ABNORMAL HIGH (ref 70–99)
Potassium: 4.6 mmol/L (ref 3.5–5.1)
Sodium: 139 mmol/L (ref 135–145)
Total Bilirubin: 1.3 mg/dL — ABNORMAL HIGH (ref 0.3–1.2)
Total Protein: 8.3 g/dL — ABNORMAL HIGH (ref 6.5–8.1)

## 2019-12-24 LAB — BRAIN NATRIURETIC PEPTIDE: B Natriuretic Peptide: 3510.4 pg/mL — ABNORMAL HIGH (ref 0.0–100.0)

## 2019-12-24 LAB — RESP PANEL BY RT-PCR (FLU A&B, COVID) ARPGX2
Influenza A by PCR: NEGATIVE
Influenza B by PCR: NEGATIVE
SARS Coronavirus 2 by RT PCR: NEGATIVE

## 2019-12-24 LAB — CBC WITH DIFFERENTIAL/PLATELET
Abs Immature Granulocytes: 0.02 10*3/uL (ref 0.00–0.07)
Basophils Absolute: 0.1 10*3/uL (ref 0.0–0.1)
Basophils Relative: 1 %
Eosinophils Absolute: 0.3 10*3/uL (ref 0.0–0.5)
Eosinophils Relative: 3 %
HCT: 27.6 % — ABNORMAL LOW (ref 36.0–46.0)
Hemoglobin: 8.4 g/dL — ABNORMAL LOW (ref 12.0–15.0)
Immature Granulocytes: 0 %
Lymphocytes Relative: 17 %
Lymphs Abs: 1.4 10*3/uL (ref 0.7–4.0)
MCH: 27.1 pg (ref 26.0–34.0)
MCHC: 30.4 g/dL (ref 30.0–36.0)
MCV: 89 fL (ref 80.0–100.0)
Monocytes Absolute: 0.3 10*3/uL (ref 0.1–1.0)
Monocytes Relative: 4 %
Neutro Abs: 6 10*3/uL (ref 1.7–7.7)
Neutrophils Relative %: 75 %
Platelets: 284 10*3/uL (ref 150–400)
RBC: 3.1 MIL/uL — ABNORMAL LOW (ref 3.87–5.11)
RDW: 22.3 % — ABNORMAL HIGH (ref 11.5–15.5)
Smear Review: NORMAL
WBC: 8.1 10*3/uL (ref 4.0–10.5)
nRBC: 0 % (ref 0.0–0.2)

## 2019-12-24 LAB — LACTIC ACID, PLASMA
Lactic Acid, Venous: 1.7 mmol/L (ref 0.5–1.9)
Lactic Acid, Venous: 1.6 mmol/L (ref 0.5–1.9)

## 2019-12-24 LAB — MRSA PCR SCREENING: MRSA by PCR: NEGATIVE

## 2019-12-24 LAB — TROPONIN I (HIGH SENSITIVITY): Troponin I (High Sensitivity): 81 ng/L — ABNORMAL HIGH (ref <18)

## 2019-12-24 MED ORDER — VANCOMYCIN HCL 500 MG/100ML IV SOLN
500.0000 mg | Freq: Once | INTRAVENOUS | Status: AC
  Administered 2019-12-25: 500 mg via INTRAVENOUS
  Filled 2019-12-24: qty 100

## 2019-12-24 MED ORDER — SODIUM CHLORIDE 0.9 % IV SOLN
2.0000 g | Freq: Once | INTRAVENOUS | Status: AC
  Administered 2019-12-24: 23:00:00 2 g via INTRAVENOUS
  Filled 2019-12-24: qty 2

## 2019-12-24 MED ORDER — LABETALOL HCL 5 MG/ML IV SOLN
10.0000 mg | Freq: Once | INTRAVENOUS | Status: AC
  Administered 2019-12-24: 22:00:00 10 mg via INTRAVENOUS
  Filled 2019-12-24: qty 4

## 2019-12-24 MED ORDER — VANCOMYCIN HCL IN DEXTROSE 1-5 GM/200ML-% IV SOLN
1000.0000 mg | Freq: Once | INTRAVENOUS | Status: AC
  Administered 2019-12-24: 22:00:00 1000 mg via INTRAVENOUS
  Filled 2019-12-24: qty 200

## 2019-12-24 MED ORDER — CHLORHEXIDINE GLUCONATE CLOTH 2 % EX PADS
6.0000 | MEDICATED_PAD | Freq: Every day | CUTANEOUS | Status: DC
  Filled 2019-12-24: qty 6

## 2019-12-24 NOTE — H&P (Signed)
History and Physical   Joyce West VOH:[id number] DOB: Jul 21, 1990 DOA: 12/24/2019  Referring MD/NP/PA: Dr. Cherylann Banas  PCP: Pcp, No   Outpatient Specialists: Nephrology  Patient coming from: Home  Chief Complaint: Shortness of breath  HPI: Joyce West is a 29 y.o. female with medical history significant of end-stage renal disease on hemodialysis as stated May of this year, diabetes, hypertension, history of CVA, recent MRSA pneumonia and bacteremia, medication noncompliance who was recently admitted at an outside hospital with MRSA pneumonia and bacteremia.  Post discharge she was supposed to get multiple weeks of vancomycin during hemodialysis but per patient she has not received much.  Came to the ER today in significant respiratory distress.  She was hypoxic and placed on BiPAP.  Patient noted to have significant pulmonary findings consistent with pulmonary edema with possible pneumonia.  She has missed her hemodialysis today.  Patient being admitted with acute on chronic respiratory failure with hypoxemia probably due to pneumonia and or pulmonary edema..  ED Course: Temperature 98.4 blood pressure 209/143, pulse 114 respiratory rate 52 and oxygen sat 96% on room air.  White count 8.1 hemoglobin 8.4 and platelets 284.  Sodium is 139 potassium 4.6 chloride 100 CO2 23 BUN 35 creatinine 10.5 and calcium 9.7.  Lactic acid 1.7.  BNP 3510.  Troponin is 81.  Acute viral screen so far negative.  MRSA screen by PCR is negative.  Chest x-ray showed cardiomegaly with mild interstitial pulmonary edema and small to moderate right pleural effusion.  Patient being admitted with acute respiratory failure most likely due to pulmonary edema.  Review of Systems: As per HPI otherwise 10 point review of systems negative.    Past Medical History:  Diagnosis Date  . Diabetes mellitus without complication ([place])   . Hypertension   . Ischemic stroke ([place])   . Renal disorder    ESRD, dialysis since may 2021   . Right sided weakness    secondary to stroke     Past Surgical History:  Procedure Laterality Date  . CESAREAN SECTION     x 3  . DIALYSIS/PERMA CATHETER INSERTION N/A 05/29/2019   Procedure: DIALYSIS/PERMA CATHETER INSERTION;  Surgeon: Algernon Huxley, MD;  Location: [facility];  Service: Cardiovascular;  Laterality: N/A;  . DIALYSIS/PERMA CATHETER INSERTION N/A 11/02/2019   Procedure: DIALYSIS/PERMA CATHETER INSERTION;  Surgeon: Algernon Huxley, MD;  Location: [facility];  Service: Cardiovascular;  Laterality: N/A;  . TUBAL LIGATION       reports that she has quit smoking. Her smoking use included cigarettes. She has never used smokeless tobacco. She reports previous alcohol use. She reports that she does not use drugs.  Allergies  Allergen Reactions  . Enalapril     cough  . Lisinopril Cough    No family history on file.   Prior to Admission medications   Medication Sig Start Date End Date Taking? Authorizing Provider  albuterol (VENTOLIN HFA) 108 (90 Base) MCG/ACT inhaler Inhale 2 puffs into the lungs every 4 (four) hours as needed. 11/13/19  Yes [provider]  amLODipine (NORVASC) 10 MG tablet Take 10 mg by mouth daily. 11/01/19  Yes [provider]  atorvastatin (LIPITOR) 80 MG tablet Take 80 mg by mouth daily. Patient does not know what does she takes   Yes [provider]  calcium acetate (PHOSLO) 667 MG capsule Take 1,334 mg by mouth 3 (three) times daily. 12/11/19  Yes [provider]  carvedilol (COREG) 25 MG tablet  Take 25 mg by mouth 2 (two) times daily. 12/02/19  Yes [provider]  gabapentin (NEURONTIN) 300 MG capsule Take 300 mg by mouth daily. 12/04/19  Yes [provider]  gentamicin cream (GARAMYCIN) 0.1 % Apply 1 application topically 2 (two) times daily. 11/18/19  Yes [provider]  hydrALAZINE (APRESOLINE) 100 MG tablet Take 100 mg by mouth 3 (three) times daily.   Yes  [provider]  hydrOXYzine (ATARAX/VISTARIL) 25 MG tablet Take 25 mg by mouth every 12 (twelve) hours as needed. 12/02/19  Yes [provider]  aspirin 81 MG chewable tablet Chew by mouth daily. Patient not taking: Reported on 11/02/2019    [provider]  guaiFENesin-codeine 100-10 MG/5ML syrup Take by mouth. 11/13/19   [provider]  liraglutide (VICTOZA) 18 MG/3ML SOPN Inject 1.8 mg into the skin daily.     [provider]  losartan (COZAAR) 100 MG tablet Take 100 mg by mouth daily. Patient not taking: Reported on 12/24/2019 12/11/19   [provider]  traMADol (ULTRAM) 50 MG tablet Take 1 tablet (50 mg total) by mouth every 6 (six) hours as needed. 04/22/19   Versie Starks, PA-C    Physical Exam: Vitals:   12/24/19 2145 12/24/19 2200 12/24/19 2215 12/24/19 2224  BP:    (!) 203/142  Pulse: (!) 109 (!) 107 100 (!) 103  Resp: (!) 38 (!) 25 10 (!) 23  Temp:      SpO2: 100% 100% 100% 100%  Weight:      Height:          Constitutional: Acutely ill looking in obvious distress Vitals:   12/24/19 2145 12/24/19 2200 12/24/19 2215 12/24/19 2224  BP:    (!) 203/142  Pulse: (!) 109 (!) 107 100 (!) 103  Resp: (!) 38 (!) 25 10 (!) 23  Temp:      SpO2: 100% 100% 100% 100%  Weight:      Height:       Eyes: PERRL, lids and conjunctivae normal ENMT: Mucous membranes are moist. Posterior pharynx clear of any exudate or lesions.Normal dentition.  Neck: normal, supple, no masses, no thyromegaly Respiratory: Decreased air entry bilaterally, marked crackles, no wheeze, t. No accessory muscle use.  Cardiovascular: Sinus tachycardia, no murmurs / rubs / gallops. No extremity edema. 2+ pedal pulses. No carotid bruits.  Abdomen: no tenderness, no masses palpated. No hepatosplenomegaly. Bowel sounds positive.  Musculoskeletal: no clubbing / cyanosis. No joint deformity upper and lower extremities. Good ROM, no contractures. Normal muscle  tone.  Skin: no rashes, lesions, ulcers. No induration Neurologic: CN 2-12 grossly intact. Sensation intact, DTR normal. Strength 5/5 in all 4.  Psychiatric: Normal judgment and insight. Alert and oriented x 3.  Anxious mood.     Labs on Admission: I have personally reviewed following labs and imaging studies  CBC: Recent Labs  Lab 12/24/19 1933  WBC 8.1  NEUTROABS 6.0  HGB 8.4*  HCT 27.6*  MCV 89.0  PLT 502   Basic Metabolic Panel: Recent Labs  Lab 12/24/19 1933  NA 139  K 4.6  CL 100  CO2 23  GLUCOSE 137*  BUN 35*  CREATININE 10.50*  CALCIUM 9.7   GFR: Estimated Creatinine Clearance: 7.7 mL/min (A) (by C-G formula based on SCr of 10.5 mg/dL (H)). Liver Function Tests: Recent Labs  Lab 12/24/19 1933  AST 24  ALT 17  ALKPHOS 110  BILITOT 1.3*  PROT 8.3*  ALBUMIN 3.7   No  results for input(s): LIPASE, AMYLASE in the last 168 hours. No results for input(s): AMMONIA in the last 168 hours. Coagulation Profile: No results for input(s): INR, PROTIME in the last 168 hours. Cardiac Enzymes: No results for input(s): CKTOTAL, CKMB, CKMBINDEX, TROPONINI in the last 168 hours. BNP (last 3 results) No results for input(s): PROBNP in the last 8760 hours. HbA1C: No results for input(s): HGBA1C in the last 72 hours. CBG: No results for input(s): GLUCAP in the last 168 hours. Lipid Profile: No results for input(s): CHOL, HDL, LDLCALC, TRIG, CHOLHDL, LDLDIRECT in the last 72 hours. Thyroid Function Tests: No results for input(s): TSH, T4TOTAL, FREET4, T3FREE, THYROIDAB in the last 72 hours. Anemia Panel: No results for input(s): VITAMINB12, FOLATE, FERRITIN, TIBC, IRON, RETICCTPCT in the last 72 hours. Urine analysis:    Component Value Date/Time   COLORURINE YELLOW (A) 04/28/2019 1435   APPEARANCEUR HAZY (A) 04/28/2019 1435   APPEARANCEUR Hazy 10/15/2013 1506   LABSPEC 1.014 04/28/2019 1435   LABSPEC 1.024 10/15/2013 1506   PHURINE 6.0 04/28/2019 1435    GLUCOSEU 150 (A) 04/28/2019 1435   GLUCOSEU >=500 10/15/2013 1506   HGBUR NEGATIVE 04/28/2019 1435   BILIRUBINUR NEGATIVE 04/28/2019 1435   BILIRUBINUR Negative 10/15/2013 1506   KETONESUR NEGATIVE 04/28/2019 1435   PROTEINUR >=300 (A) 04/28/2019 1435   UROBILINOGEN 0.2 03/15/2008 1619   NITRITE NEGATIVE 04/28/2019 1435   LEUKOCYTESUR NEGATIVE 04/28/2019 1435   LEUKOCYTESUR Negative 10/15/2013 1506   Sepsis Labs: @LABRCNTIP (procalcitonin:4,lacticidven:4) ) Recent Results (from the past 240 hour(s))  Resp Panel by RT-PCR (Flu A&B, Covid) Nasopharyngeal Swab     Status: None   Collection Time: 12/24/19  9:27 PM   Specimen: Nasopharyngeal Swab; Nasopharyngeal(NP) swabs in vial transport medium  Result Value Ref Range Status   SARS Coronavirus 2 by RT PCR NEGATIVE NEGATIVE Final    Comment: (NOTE) SARS-CoV-2 target nucleic acids are NOT DETECTED.  The SARS-CoV-2 RNA is generally detectable in upper respiratory specimens during the acute phase of infection. The lowest concentration of SARS-CoV-2 viral copies this assay can detect is 138 copies/mL. A negative result does not preclude SARS-Cov-2 infection and should not be used as the sole basis for treatment or other patient management decisions. A negative result may occur with  improper specimen collection/handling, submission of specimen other than nasopharyngeal swab, presence of viral mutation(s) within the areas targeted by this assay, and inadequate number of viral copies(<138 copies/mL). A negative result must be combined with clinical observations, patient history, and epidemiological information. The expected result is Negative.  Fact Sheet for Patients:  EntrepreneurPulse.com.au  Fact Sheet for Healthcare Providers:  IncredibleEmployment.be  This test is no t yet approved or cleared by the [place] FDA and  has been authorized for detection and/or diagnosis of SARS-CoV-2 by FDA  under an Emergency Use Authorization (EUA). This EUA will remain  in effect (meaning this test can be used) for the duration of the COVID-19 declaration under Section 564(b)(1) of the Act, 21 U.S.C.section 360bbb-3(b)(1), unless the authorization is terminated  or revoked sooner.       Influenza A by PCR NEGATIVE NEGATIVE Final   Influenza B by PCR NEGATIVE NEGATIVE Final    Comment: (NOTE) The Xpert Xpress SARS-CoV-2/FLU/RSV plus assay is intended as an aid in the diagnosis of influenza from Nasopharyngeal swab specimens and should not be used as a sole basis for treatment. Nasal washings and aspirates are unacceptable for Xpert Xpress SARS-CoV-2/FLU/RSV testing.  Fact Sheet for Patients: EntrepreneurPulse.com.au  Fact Sheet for Healthcare Providers: IncredibleEmployment.be  This test is not yet approved or cleared by the [place] FDA and has been authorized for detection and/or diagnosis of SARS-CoV-2 by FDA under an Emergency Use Authorization (EUA). This EUA will remain in effect (meaning this test can be used) for the duration of the COVID-19 declaration under Section 564(b)(1) of the Act, 21 U.S.C. section 360bbb-3(b)(1), unless the authorization is terminated or revoked.  Performed at [facility], [address] [place], [place] [address]   MRSA PCR Screening     Status: None   Collection Time: 12/24/19  9:27 PM   Specimen: Nasal Mucosa; Nasopharyngeal  Result Value Ref Range Status   MRSA by PCR NEGATIVE NEGATIVE Final    Comment:        The GeneXpert MRSA Assay (FDA approved for NASAL specimens only), is one component of a comprehensive MRSA colonization surveillance program. It is not intended to diagnose MRSA infection nor to guide or monitor treatment for MRSA infections. Performed at [facility], [address] [place], [place] [address]      Radiological Exams on Admission: DG Chest 2  View  Result Date: 12/24/2019 CLINICAL DATA:  Dyspnea EXAM: CHEST - 2 VIEW COMPARISON:  07/07/2019 FINDINGS: Small to moderate right pleural effusion is present. There is mild compressive atelectasis of the right lung base. There is perihilar interstitial pulmonary infiltrate, asymmetrically more severe within the right lung, suspicious for mild interstitial pulmonary edema, possibly cardiogenic in nature. No confluent pulmonary infiltrate. No pneumothorax. Mild cardiomegaly is present, new from prior examination. Left internal jugular hemodialysis catheter is seen with its tips within the superior vena cava. IMPRESSION: Interval development of mild cardiomegaly, mild interstitial pulmonary edema, possibly cardiogenic in nature, and small to moderate right pleural effusion. Interval exchange of hemodialysis catheter with split tip catheter tips noted within the superior vena cava. Electronically Signed   By: Fidela Salisbury MD   On: 12/24/2019 20:44      Assessment/Plan Principal Problem:   Acute respiratory failure with hypoxia (HCC) Active Problems:   Hypertension   Type 2 diabetes mellitus with stage 3 chronic kidney disease (HCC)   ESRD (end stage renal disease) ([place])   MRSA pneumonia ([place])     #1 acute respiratory failure with hypoxia: Favors pulmonary edema over infection.  She had negative MRSA PCR from nasal cavity.  Most likely not MRSA but will empirically treat until at least after hemodialysis to see how patient does.  We will consult nephrology for hemodialysis.  #2 end-stage renal disease: We will consult nephrology again for hemodialysis.  #3 type 2 diabetes: Sliding scale insulin.  #4 malignant hypertension: May be relieved with hemodialysis.  Blood pressure currently markedly elevated.  We will place patient in stepdown unit until blood pressure improves   DVT prophylaxis: Warfarin Code Status: Full code Family Communication: No family at bedside Disposition Plan:  Home Consults called: Nephrology Admission status: Inpatient  Severity of Illness: The appropriate patient status for this patient is INPATIENT. Inpatient status is judged to be reasonable and necessary in order to provide the required intensity of service to ensure the patient's safety. The patient's presenting symptoms, physical exam findings, and initial radiographic and laboratory data in the context of their chronic comorbidities is felt to place them at high risk for further clinical deterioration. Furthermore, it is not anticipated that the patient will be medically stable for discharge from the hospital within 2 midnights of admission. The following factors  support the patient status of inpatient.   " The patient's presenting symptoms include shortness of breath. " The worrisome physical exam findings include respiratory distress. " The initial radiographic and laboratory data are worrisome because of evidence of pulmonary edema. " The chronic co-morbidities include end-stage renal disease.   * I certify that at the point of admission it is my clinical judgment that the patient will require inpatient hospital care spanning beyond 2 midnights from the point of admission due to high intensity of service, high risk for further deterioration and high frequency of surveillance required.Barbette Merino MD [facility] Pager [phone]  If 7PM-7AM, please contact night-coverage [email or web address] Password [facility]  12/24/2019, 11:22 PM

## 2019-12-24 NOTE — ED Notes (Signed)
Nephrologist at bedside

## 2019-12-24 NOTE — Progress Notes (Signed)
PHARMACY -  BRIEF ANTIBIOTIC NOTE   Pharmacy has received consult(s) for Cefepime from an ED provider.  The patient's profile has been reviewed for ht/wt/allergies/indication/available labs.    One time order(s) placed for Cefepime 2 gm IV X 1   Further antibiotics/pharmacy consults should be ordered by admitting physician if indicated.                       Thank you, Callaghan Laverdure D 12/24/2019  11:00 PM

## 2019-12-24 NOTE — ED Notes (Signed)
Report off to shannon rn

## 2019-12-24 NOTE — ED Notes (Signed)
Pt woke while MD at bedside and able to talk without increased distress while on Bipap.

## 2019-12-24 NOTE — ED Notes (Signed)
Pt to ED via [facility] from home c/o [facility], dx with PNA 51mo ago with no improvement. Per EMS pt was tachypneac. EMS put pt on Cpap, to try to help with respiratory rate, pt did not tolerate well.  O2 95% room air, 100% on CPAP.  HR 118 CO2 28 BP 214/140

## 2019-12-24 NOTE — ED Notes (Signed)
This RN attempted to call back Mother to give up date. Phone went straight to voicemail and voice mail box is full.

## 2019-12-24 NOTE — ED Notes (Signed)
Pt arrives in room tachypneic and sats 96% on 3 L [place]. Pt states she had pneumonia 1 month ago and has not recovered from it. Pt states she was supposed to be getting Vancomycin at Dialysis but has had issues and not receiving it. Pt states her need for oxygen has increased and recently has been having to use oxygen at Dialysis. Pt missed dialysis appointment on Wednesday. Pt has diaysis port to left chest. Pt placed on monitor, IV access established, ED provider at bedside.

## 2019-12-24 NOTE — Consult Note (Signed)
PHARMACY -  BRIEF ANTIBIOTIC NOTE   Pharmacy has received consult(s) for PNA from an ED provider.  The patient's profile has been reviewed for ht/wt/allergies/indication/available labs.    One time order(s) placed for vancomcyin  Further antibiotics/pharmacy consults should be ordered by admitting physician if indicated.                       Thank you, Oswald Hillock 12/24/2019  9:01 PM

## 2019-12-24 NOTE — ED Triage Notes (Addendum)
PT to ED via EMS from home. PT was dx with MRSA PNA about a month ago. PT thinks that they dialysis nurses quit giving her her abx and has been having trouble breathing and cannot lay flat. PT has to breath every few words, states cannot walk more than a couple feet in her house.  PT missed dialysis Wednesday, last tx Monday.

## 2019-12-24 NOTE — Progress Notes (Signed)
[facility]  ROUNDING NOTE   Subjective:   [name] was admitted to [facility] on 12/24/2019 for Acute respiratory failure with hypoxia ([place]) [J96.01]  Last hemodialysis treatment was 12/13 where she left at 76.4kg. She missed her Wednesday scheduled dialysis treatment.   Patient states that she has been getting more and more short of breath. Placed on BIPAP. Patient with hypertensive urgency.   She states that she feels that her pneumonia has not improved. She is on outpatient IV vancomycin with last trough of 24 and so last dose of vanc was held. She denies any fevers or chills, no cough, Denies peripheral edema.   CXR with right side pleural effusion and crackles on the left.    Objective:  Vital signs in last 24 hours:  Temp:  [98.4 F (36.9 C)] 98.4 F (36.9 C) (12/16 1848) Pulse Rate:  [86-114] 86 (12/16 2319) Resp:  [10-52] 19 (12/16 2319) BP: (176-209)/(129-143) 176/129 (12/16 2319) SpO2:  [96 %-100 %] 100 % (12/16 2319) Weight:  [75 kg] 75 kg (12/16 1850)  Weight change:  Filed Weights   12/24/19 1850  Weight: 75 kg    Intake/Output: No intake/output data recorded.   Intake/Output this shift:  No intake/output data recorded.  Physical Exam: General: In respiratory distress  Head: +BIPAP   Eyes: Anicteric, PERRL  Neck: + JVD  Lungs:  Right side diminished, left side with crackles  Heart: Regular rate and rhythm  Abdomen:  Soft, nontender,   Extremities: no peripheral edema.  Neurologic: Nonfocal, moving all four extremities  Skin: No lesions  Access: LIJ permcath    Basic Metabolic Panel: Recent Labs  Lab 12/24/19 1933  NA 139  K 4.6  CL 100  CO2 23  GLUCOSE 137*  BUN 35*  CREATININE 10.50*  CALCIUM 9.7    Liver Function Tests: Recent Labs  Lab 12/24/19 1933  AST 24  ALT 17  ALKPHOS 110  BILITOT 1.3*  PROT 8.3*  ALBUMIN 3.7   No results for input(s): LIPASE, AMYLASE in the last 168 hours. No results for input(s):  AMMONIA in the last 168 hours.  CBC: Recent Labs  Lab 12/24/19 1933  WBC 8.1  NEUTROABS 6.0  HGB 8.4*  HCT 27.6*  MCV 89.0  PLT 284    Cardiac Enzymes: No results for input(s): CKTOTAL, CKMB, CKMBINDEX, TROPONINI in the last 168 hours.  BNP: Invalid input(s): POCBNP  CBG: No results for input(s): GLUCAP in the last 168 hours.  Microbiology: Results for orders placed or performed during the hospital encounter of 12/24/19  Resp Panel by RT-PCR (Flu A&B, Covid) Nasopharyngeal Swab     Status: None   Collection Time: 12/24/19  9:27 PM   Specimen: Nasopharyngeal Swab; Nasopharyngeal(NP) swabs in vial transport medium  Result Value Ref Range Status   SARS Coronavirus 2 by RT PCR NEGATIVE NEGATIVE Final    Comment: (NOTE) SARS-CoV-2 target nucleic acids are NOT DETECTED.  The SARS-CoV-2 RNA is generally detectable in upper respiratory specimens during the acute phase of infection. The lowest concentration of SARS-CoV-2 viral copies this assay can detect is 138 copies/mL. A negative result does not preclude SARS-Cov-2 infection and should not be used as the sole basis for treatment or other patient management decisions. A negative result may occur with  improper specimen collection/handling, submission of specimen other than nasopharyngeal swab, presence of viral mutation(s) within the areas targeted by this assay, and inadequate number of viral copies(<138 copies/mL). A negative result must be  combined with clinical observations, patient history, and epidemiological information. The expected result is Negative.  Fact Sheet for Patients:  EntrepreneurPulse.com.au  Fact Sheet for Healthcare Providers:  IncredibleEmployment.be  This test is no t yet approved or cleared by the [place] FDA and  has been authorized for detection and/or diagnosis of SARS-CoV-2 by FDA under an Emergency Use Authorization (EUA). This EUA will remain  in  effect (meaning this test can be used) for the duration of the COVID-19 declaration under Section 564(b)(1) of the Act, 21 U.S.C.section 360bbb-3(b)(1), unless the authorization is terminated  or revoked sooner.       Influenza A by PCR NEGATIVE NEGATIVE Final   Influenza B by PCR NEGATIVE NEGATIVE Final    Comment: (NOTE) The Xpert Xpress SARS-CoV-2/FLU/RSV plus assay is intended as an aid in the diagnosis of influenza from Nasopharyngeal swab specimens and should not be used as a sole basis for treatment. Nasal washings and aspirates are unacceptable for Xpert Xpress SARS-CoV-2/FLU/RSV testing.  Fact Sheet for Patients: EntrepreneurPulse.com.au  Fact Sheet for Healthcare Providers: IncredibleEmployment.be  This test is not yet approved or cleared by the [place] FDA and has been authorized for detection and/or diagnosis of SARS-CoV-2 by FDA under an Emergency Use Authorization (EUA). This EUA will remain in effect (meaning this test can be used) for the duration of the COVID-19 declaration under Section 564(b)(1) of the Act, 21 U.S.C. section 360bbb-3(b)(1), unless the authorization is terminated or revoked.  Performed at [facility], [address] [place], [place] [address]   MRSA PCR Screening     Status: None   Collection Time: 12/24/19  9:27 PM   Specimen: Nasal Mucosa; Nasopharyngeal  Result Value Ref Range Status   MRSA by PCR NEGATIVE NEGATIVE Final    Comment:        The GeneXpert MRSA Assay (FDA approved for NASAL specimens only), is one component of a comprehensive MRSA colonization surveillance program. It is not intended to diagnose MRSA infection nor to guide or monitor treatment for MRSA infections. Performed at [facility], [address] [place], [place] [address]     Coagulation Studies: No results for input(s): LABPROT, INR in the last 72 hours.  Urinalysis: No results for  input(s): COLORURINE, LABSPEC, PHURINE, GLUCOSEU, HGBUR, BILIRUBINUR, KETONESUR, PROTEINUR, UROBILINOGEN, NITRITE, LEUKOCYTESUR in the last 72 hours.  Invalid input(s): APPERANCEUR    Imaging: DG Chest 2 View  Result Date: 12/24/2019 CLINICAL DATA:  Dyspnea EXAM: CHEST - 2 VIEW COMPARISON:  07/07/2019 FINDINGS: Small to moderate right pleural effusion is present. There is mild compressive atelectasis of the right lung base. There is perihilar interstitial pulmonary infiltrate, asymmetrically more severe within the right lung, suspicious for mild interstitial pulmonary edema, possibly cardiogenic in nature. No confluent pulmonary infiltrate. No pneumothorax. Mild cardiomegaly is present, new from prior examination. Left internal jugular hemodialysis catheter is seen with its tips within the superior vena cava. IMPRESSION: Interval development of mild cardiomegaly, mild interstitial pulmonary edema, possibly cardiogenic in nature, and small to moderate right pleural effusion. Interval exchange of hemodialysis catheter with split tip catheter tips noted within the superior vena cava. Electronically Signed   By: Fidela Salisbury MD   On: 12/24/2019 20:44     Medications:   . ceFEPime (MAXIPIME) IV 2 g (12/24/19 2315)   . [START ON 12/25/2019] Chlorhexidine Gluconate Cloth  6 each Topical Q0600     Assessment/ Plan:  [name] is a 29 y.o. black female with  end stage renal disease on hemodialysis, hypertension, CVA with residual right sided weakness, diabetes mellitus type II insulin dependent, history of preeclampsia who is admitted to [facility] on 12/24/2019 for Acute respiratory failure with hypoxia (HCC) [J96.01]  [facility] MWF [facility] Left IJ permcath 74kg  1. End Stage Renal Disease: last dialysis was Monday, 12/13. She has pulmonary edema and needs emergent dialysis. Orders prepared.   2. Hypertension: with urgency. Patient states she did take her medications. Has been given IV  labetolol.  Home regimen of hydralazine, amlodipine, carvedilol and losartan  3. Anemia of chronic kidney disease: hemoglobin 8.4. Hold ESA due to hypertension.   4. Secondary Hyperparathyroidism with hyperphosphatemia. PTH from 12/13 was elevated at 1628.  - resume calcium acetate with meals.   5. Acute respiratory failure: requiring noninvasive ventilation. CXR with pulmonary edema and right sided pleural effusion. With recent pneumonia.  - Empiric antibiotics - If dialysis does not improve respiratory status. Then will get CT scan of lungs.    LOS: 0 Joyce West 12/16/202111:32 PM

## 2019-12-24 NOTE — ED Provider Notes (Signed)
[facility] Emergency Department Provider Note ____________________________________________   Event Date/Time   First MD Initiated Contact with Patient 12/24/19 2047     (approximate)  I have reviewed the triage vital signs and the nursing notes.   HISTORY  Chief Complaint Shortness of Breath    HPI Joyce West is a 29 y.o. female with PMH as noted below including ESRD on dialysis, hypertension, diabetes as well as a recent hospitalization for MRSA pneumonia who presents with shortness of breath over the last week, gradual onset, associated with nonproductive cough.  There is not associated with fever.  The patient states that she gets dialysis M/W/F.  She states that she missed her Wednesday dialysis (yesterday) due to not feeling well, and so last went 3 days ago.  She states that her weights have been stable recently.  She denies any leg swelling.  She states that she feels like the pneumonia is back.  She reports that she was supposed to get vancomycin during her dialysis sessions for 2 weeks, but only received it for a few days because of a problem with the order.  Past Medical History:  Diagnosis Date  . Diabetes mellitus without complication ([place])   . Hypertension   . Ischemic stroke ([place])   . Renal disorder    ESRD, dialysis since may 2021  . Right sided weakness    secondary to stroke     Patient Active Problem List   Diagnosis Date Noted  . Acute respiratory failure with hypoxia ([place]) 12/24/2019  . AKI (acute kidney injury) ([place]) 04/22/2019  . Hypertension   . Type 2 diabetes mellitus with stage 3 chronic kidney disease ([place])     Past Surgical History:  Procedure Laterality Date  . CESAREAN SECTION     x 3  . DIALYSIS/PERMA CATHETER INSERTION N/A 05/29/2019   Procedure: DIALYSIS/PERMA CATHETER INSERTION;  Surgeon: Algernon Huxley, MD;  Location: [place] [facility];  Service: Cardiovascular;  Laterality: N/A;  . DIALYSIS/PERMA CATHETER  INSERTION N/A 11/02/2019   Procedure: DIALYSIS/PERMA CATHETER INSERTION;  Surgeon: Algernon Huxley, MD;  Location: [place] [facility];  Service: Cardiovascular;  Laterality: N/A;  . TUBAL LIGATION      Prior to Admission medications   Medication Sig Start Date End Date Taking? Authorizing Provider  albuterol (VENTOLIN HFA) 108 (90 Base) MCG/ACT inhaler Inhale 2 puffs into the lungs every 4 (four) hours as needed. 11/13/19   [provider]  amLODipine (NORVASC) 10 MG tablet Take 10 mg by mouth daily. 11/01/19   [provider]  aspirin 81 MG chewable tablet Chew by mouth daily. Patient not taking: Reported on 11/02/2019    [provider]  atorvastatin (LIPITOR) 10 MG tablet Take 10 mg by mouth daily. Patient does not know what does she takes    [provider]  calcium acetate (PHOSLO) 667 MG capsule Take 1,334 mg by mouth 3 (three) times daily. 12/11/19   [provider]  carvedilol (COREG) 25 MG tablet Take 25 mg by mouth 2 (two) times daily. 12/02/19   [provider]  gabapentin (NEURONTIN) 300 MG capsule Take 300 mg by mouth daily. 12/04/19   [provider]  gentamicin cream (GARAMYCIN) 0.1 % Apply topically. 11/18/19   [provider]  guaiFENesin-codeine 100-10 MG/5ML syrup Take by mouth. 11/13/19   [provider]  hydrALAZINE (APRESOLINE) 25 MG tablet Take 25 mg by mouth 4 (four) times daily.     [provider]  hydrOXYzine (ATARAX/VISTARIL) 10 MG tablet Take 10 mg by mouth at bedtime. 11/20/19   [provider]  hydrOXYzine (ATARAX/VISTARIL) 25 MG tablet Take 25 mg by mouth 2 (two) times daily. 12/02/19   [provider]  liraglutide (VICTOZA) 18 MG/3ML SOPN Inject 1.8 mg into the skin daily.     [provider]  losartan (COZAAR) 100 MG tablet Take 100 mg by mouth daily. 12/11/19   [provider]  traMADol (ULTRAM) 50 MG tablet Take 1 tablet (50 mg total) by  mouth every 6 (six) hours as needed. 04/22/19   Caryn Section Linden Dolin, PA-C  warfarin (COUMADIN) 2.5 MG tablet SMARTSIG:1 Tablet(s) By Mouth Every Evening 09/30/19   [provider]    Allergies Enalapril and Lisinopril  No family history on file.  Social History Social History   Tobacco Use  . Smoking status: Former Smoker    Types: Cigarettes  . Smokeless tobacco: Never Used  Substance Use Topics  . Alcohol use: Not Currently  . Drug use: Never    Review of Systems  Constitutional: No fever. Eyes: No redness. ENT: No sore throat. Cardiovascular: Denies chest pain. Respiratory: Positive for shortness of breath. Gastrointestinal: No vomiting. Genitourinary: Negative for dysuria.  Musculoskeletal: Negative for back pain. Skin: Negative for rash. Neurological: Negative for headache.   ____________________________________________   PHYSICAL EXAM:  VITAL SIGNS: ED Triage Vitals  Enc Vitals Group     BP 12/24/19 1848 (!) 198/143     Pulse Rate 12/24/19 1848 (!) 112     Resp 12/24/19 1848 (!) 38     Temp 12/24/19 1848 98.4 F (36.9 C)     Temp src --      SpO2 12/24/19 1848 100 %     Weight 12/24/19 1850 165 lb 5.5 oz (75 kg)     Height 12/24/19 1850 5\' 4"  (1.626 m)     Head Circumference --      Peak Flow --      Pain Score 12/24/19 1849 8     Pain Loc --      Pain Edu? --      Excl. in [place]? --     Constitutional: Alert and oriented.  Uncomfortable appearing but in no acute distress. Eyes: Conjunctivae are normal.  Head: Atraumatic. Nose: No congestion/rhinnorhea. Mouth/Throat: Mucous membranes are somewhat dry. Neck: Normal range of motion.  Cardiovascular: Tachycardic, regular rhythm. Grossly normal heart sounds.  Good peripheral circulation. Respiratory: Increased respiratory effort with tachypnea and accessory muscle use. Lungs with faint rales bilaterally. Gastrointestinal: Soft and nontender. No distention.  Genitourinary: No flank  tenderness. Musculoskeletal: No lower extremity edema.  Extremities warm and well perfused.  Neurologic:  Normal speech and language. No gross focal neurologic deficits are appreciated.  Skin:  Skin is warm and dry. No rash noted. Psychiatric: Mood and affect are normal. Speech and behavior are normal.  ____________________________________________   LABS (all labs ordered are listed, but only abnormal results are displayed)  Labs Reviewed  COMPREHENSIVE METABOLIC PANEL - Abnormal; Notable for the following components:      Result Value   Glucose, Bld 137 (*)    BUN 35 (*)    Creatinine, Ser 10.50 (*)    Total Protein 8.3 (*)    Total Bilirubin 1.3 (*)    GFR, Estimated 5 (*)    Anion gap 16 (*)    All other components within normal limits  CBC WITH DIFFERENTIAL/PLATELET - Abnormal; Notable for the following  components:   RBC 3.10 (*)    Hemoglobin 8.4 (*)    HCT 27.6 (*)    RDW 22.3 (*)    All other components within normal limits  BRAIN NATRIURETIC PEPTIDE - Abnormal; Notable for the following components:   B Natriuretic Peptide 3,510.4 (*)    All other components within normal limits  TROPONIN I (HIGH SENSITIVITY) - Abnormal; Notable for the following components:   Troponin I (High Sensitivity) 81 (*)    All other components within normal limits  RESP PANEL BY RT-PCR (FLU A&B, COVID) ARPGX2  MRSA PCR SCREENING  CULTURE, BLOOD (ROUTINE X 2)  CULTURE, BLOOD (ROUTINE X 2)  LACTIC ACID, PLASMA  LACTIC ACID, PLASMA  URINALYSIS, COMPLETE (UACMP) WITH MICROSCOPIC  PATHOLOGIST SMEAR REVIEW  POC URINE PREG, ED  TROPONIN I (HIGH SENSITIVITY)   ____________________________________________  EKG  ED ECG REPORT I, Arta Silence, the attending physician, personally viewed and interpreted this ECG.  Date: 12/24/2019 EKG Time: 1902 Rate: 111 Rhythm: Sinus tachycardia QRS Axis: Right axis Intervals: normal ST/T Wave abnormalities: Nonspecific T wave  flattening Narrative Interpretation: Nonspecific abnormalities with no evidence of acute ischemia  ____________________________________________  RADIOLOGY  CXR interpreted by me shows cardiomegaly and mild interstitial edema  ____________________________________________   PROCEDURES  Procedure(s) performed: No  Procedures  Critical Care performed: Yes  CRITICAL CARE Performed by: Arta Silence   Total critical care time: 35 minutes  Critical care time was exclusive of separately billable procedures and treating other patients.  Critical care was necessary to treat or prevent imminent or life-threatening deterioration.  Critical care was time spent personally by me on the following activities: development of treatment plan with patient and/or surrogate as well as nursing, discussions with consultants, evaluation of patient's response to treatment, examination of patient, obtaining history from patient or surrogate, ordering and performing treatments and interventions, ordering and review of laboratory studies, ordering and review of radiographic studies, pulse oximetry and re-evaluation of patient's condition.  ____________________________________________   INITIAL IMPRESSION / ASSESSMENT AND PLAN / ED COURSE  Pertinent labs & imaging results that were available during my care of the patient were reviewed by me and considered in my medical decision making (see chart for details).  29 year old female with PMH as noted above including ESRD on dialysis and a recent admission for MRSA pneumonia presents with worsening shortness of breath over approximately the last week.  The patient missed 1 dialysis session yesterday.  She states that she was supposed to be on IV vancomycin at her dialysis sessions for 2 weeks after her recent admission, but only received it for a few days.  I reviewed the past medical records in care everywhere.  The patient was admitted at [facility] from  11/17-11/26 for MRSA bacteremia and pneumonia treated with IV vancomycin.  Her dialysis catheter was exchanged.  She was discharged on 2 L of nasal cannula.  In addition the patient had pericarditis.  On exam today, the patient is uncomfortable appearing.  She is tachypneic with a respiratory rate in the 30s, tachycardic, and hypertensive.  O2 saturation is 100% on 2 L by nasal cannula.  She has some rales bilaterally.  There is no significant peripheral edema.  Exam is otherwise as described above.  Initial work-up is more suggestive of pulmonary edema/fluid overloaded than infection, with a normal WBC count and x-ray appearing more consistent with edema than a focal infiltrate.  However differential also includes new or recurrent pneumonia, COVID-19, other infectious etiology, or less  likely primary cardiac cause.    Although the patient is oxygenating well on nasal cannula, due to the significantly increased work of breathing I have ordered BiPAP.  We will obtain some additional lab work-up, give IV vancomycin, and plan for admission.  ----------------------------------------- 11:06 PM on 12/24/2019 -----------------------------------------  The patient's work of breathing has significantly improved on BiPAP although her blood pressure remained elevated.  I ordered IV labetalol and it has improved.  I consulted Dr. Juleen China from nephrology to arrange for dialysis tonight.  I discussed the case with Dr. Jonelle Sidle from the hospitalist service for admission.  __________________________  Launa Grill was evaluated in Emergency Department on 12/24/2019 for the symptoms described in the history of present illness. She was evaluated in the context of the global COVID-19 pandemic, which necessitated consideration that the patient might be at risk for infection with the SARS-CoV-2 virus that causes COVID-19. Institutional protocols and algorithms that pertain to the evaluation of patients at risk for  COVID-19 are in a state of rapid change based on information released by regulatory bodies including the CDC and federal and state organizations. These policies and algorithms were followed during the patient's care in the ED.  ____________________________________________   FINAL CLINICAL IMPRESSION(S) / ED DIAGNOSES  Final diagnoses:  Acute respiratory failure, unspecified whether with hypoxia or hypercapnia (HCC)  Acute pulmonary edema (HCC)      NEW MEDICATIONS STARTED DURING THIS VISIT:  New Prescriptions   No medications on file     Note:  This document was prepared using Dragon voice recognition software and may include unintentional dictation errors.    Arta Silence, MD 12/24/19 [phone]

## 2019-12-25 DIAGNOSIS — N186 End stage renal disease: Secondary | ICD-10-CM

## 2019-12-25 DIAGNOSIS — J9601 Acute respiratory failure with hypoxia: Secondary | ICD-10-CM | POA: Diagnosis not present

## 2019-12-25 DIAGNOSIS — J810 Acute pulmonary edema: Secondary | ICD-10-CM | POA: Diagnosis not present

## 2019-12-25 LAB — HEMOGLOBIN A1C
Hgb A1c MFr Bld: 5.1 % (ref 4.8–5.6)
Mean Plasma Glucose: 99.67 mg/dL

## 2019-12-25 LAB — PATHOLOGIST SMEAR REVIEW

## 2019-12-25 LAB — CBG MONITORING, ED: Glucose-Capillary: 89 mg/dL (ref 70–99)

## 2019-12-25 LAB — PROTIME-INR
INR: 1.1 (ref 0.8–1.2)
Prothrombin Time: 13.9 seconds (ref 11.4–15.2)

## 2019-12-25 LAB — TROPONIN I (HIGH SENSITIVITY): Troponin I (High Sensitivity): 89 ng/L — ABNORMAL HIGH (ref <18)

## 2019-12-25 LAB — HEPATITIS B SURFACE ANTIGEN: Hepatitis B Surface Ag: NONREACTIVE

## 2019-12-25 MED ORDER — ONDANSETRON HCL 4 MG PO TABS: 4.0000 mg | ORAL_TABLET | Freq: Four times a day (QID) | ORAL | Status: DC | PRN

## 2019-12-25 MED ORDER — CARVEDILOL 25 MG PO TABS: 25.0000 mg | ORAL_TABLET | Freq: Two times a day (BID) | ORAL | Status: DC

## 2019-12-25 MED ORDER — DOCUSATE SODIUM 283 MG RE ENEM: 1.0000 | ENEMA | RECTAL | Status: DC | PRN

## 2019-12-25 MED ORDER — NEPRO/CARBSTEADY PO LIQD: 237.0000 mL | Freq: Three times a day (TID) | ORAL | Status: DC | PRN

## 2019-12-25 MED ORDER — CALCIUM CARBONATE ANTACID 1250 MG/5ML PO SUSP
500.0000 mg | Freq: Four times a day (QID) | ORAL | Status: DC | PRN
  Filled 2019-12-25: qty 5

## 2019-12-25 MED ORDER — AMLODIPINE BESYLATE 5 MG PO TABS
10.0000 mg | ORAL_TABLET | Freq: Every day | ORAL | Status: DC
  Administered 2019-12-25: 10:00:00 10 mg via ORAL
  Filled 2019-12-25: qty 2

## 2019-12-25 MED ORDER — VANCOMYCIN HCL 750 MG/150ML IV SOLN: 750.0000 mg | INTRAVENOUS | Status: DC

## 2019-12-25 MED ORDER — ONDANSETRON HCL 4 MG/2ML IJ SOLN: 4.0000 mg | Freq: Four times a day (QID) | INTRAMUSCULAR | Status: DC | PRN

## 2019-12-25 MED ORDER — MORPHINE SULFATE (PF) 2 MG/ML IV SOLN
1.0000 mg | INTRAVENOUS | Status: DC | PRN
  Administered 2019-12-25: 1 mg via INTRAVENOUS
  Filled 2019-12-25: qty 1

## 2019-12-25 MED ORDER — HYDRALAZINE HCL 50 MG PO TABS: 100.0000 mg | ORAL_TABLET | Freq: Three times a day (TID) | ORAL | Status: DC

## 2019-12-25 MED ORDER — ACETAMINOPHEN 650 MG RE SUPP: 650.0000 mg | Freq: Four times a day (QID) | RECTAL | Status: DC | PRN

## 2019-12-25 MED ORDER — ACETAMINOPHEN 325 MG PO TABS
650.0000 mg | ORAL_TABLET | Freq: Four times a day (QID) | ORAL | Status: DC | PRN
  Administered 2019-12-25: 650 mg via ORAL
  Filled 2019-12-25: qty 2

## 2019-12-25 MED ORDER — SORBITOL 70 % SOLN
30.0000 mL | Status: DC | PRN
  Filled 2019-12-25: qty 30

## 2019-12-25 MED ORDER — INSULIN ASPART 100 UNIT/ML ~~LOC~~ SOLN: 0.0000 [IU] | Freq: Three times a day (TID) | SUBCUTANEOUS | Status: DC

## 2019-12-25 MED ORDER — OXYCODONE-ACETAMINOPHEN 5-325 MG PO TABS
1.0000 | ORAL_TABLET | Freq: Four times a day (QID) | ORAL | Status: DC | PRN
  Administered 2019-12-25: 1 via ORAL
  Filled 2019-12-25 (×2): qty 1

## 2019-12-25 MED ORDER — CAMPHOR-MENTHOL 0.5-0.5 % EX LOTN
1.0000 | TOPICAL_LOTION | Freq: Three times a day (TID) | CUTANEOUS | Status: DC | PRN
Start: 2019-12-24 — End: 2019-12-25
  Filled 2019-12-25: qty 222

## 2019-12-25 MED ORDER — HYDROXYZINE HCL 25 MG PO TABS
25.0000 mg | ORAL_TABLET | Freq: Three times a day (TID) | ORAL | Status: DC | PRN
  Administered 2019-12-25: 10:00:00 25 mg via ORAL
  Filled 2019-12-25: qty 1

## 2019-12-25 MED ORDER — LOSARTAN POTASSIUM 50 MG PO TABS
100.0000 mg | ORAL_TABLET | Freq: Every day | ORAL | Status: DC
  Administered 2019-12-25: 10:00:00 100 mg via ORAL
  Filled 2019-12-25: qty 2

## 2019-12-25 MED ORDER — SODIUM CHLORIDE 0.9 % IV SOLN: 1.0000 g | INTRAVENOUS | Status: DC

## 2019-12-25 MED ORDER — ZOLPIDEM TARTRATE 5 MG PO TABS
5.0000 mg | ORAL_TABLET | Freq: Every evening | ORAL | Status: DC | PRN
  Administered 2019-12-25: 04:00:00 5 mg via ORAL
  Filled 2019-12-25: qty 1

## 2019-12-25 MED ORDER — INSULIN ASPART 100 UNIT/ML ~~LOC~~ SOLN: 0.0000 [IU] | Freq: Every day | SUBCUTANEOUS | Status: DC

## 2019-12-25 NOTE — ED Notes (Signed)
Pt placed on 3L which is baseline. Pts oxygen saturation is 100% at this time but Bipap remains at bedside.

## 2019-12-25 NOTE — Progress Notes (Signed)
Completed 3 hours of emergency dialysis. Removed 3.5 Total fluid (3 liters net) Pt resting but remains hypertensive.  Cath dressing changed and limbs heparin locked.

## 2019-12-25 NOTE — Progress Notes (Signed)
Patient refuses to keep BIPAP on she has removed it at this time and will not put Jennings back on either.  Relayed this information to ED charge RN, patient's RN phone not connecting at this time.

## 2019-12-25 NOTE — ED Notes (Signed)
Upon entering room to medicate patient, pt states "Oh don't think you're going to come in here and be all nice to me now".  Pt was advised that I had medication to give, pt states "well, you aint giving me nothing. AS  I told another nurse, get out of my room, I don't want you back in here".  Provider and charge RN notified.

## 2019-12-25 NOTE — ED Notes (Signed)
Pt in room, BIPAP removed by herself.  States she needs a break, wishes to go home.  Pt refuses to wear BIPAP or oxygen.  O2 sats 88-90% RA.  Provider notified.

## 2019-12-25 NOTE — ED Notes (Signed)
Dialysis nurse arrived bedside preparing to start dialysis treatment

## 2019-12-25 NOTE — ED Notes (Signed)
Pharmacy contacted requesting to tube ordered medication for RN to administer.

## 2019-12-25 NOTE — Progress Notes (Signed)
Pharmacy Antibiotic Note  Joyce West is a 29 y.o. female admitted on 12/24/2019 with HCAP.  Pharmacy has been consulted for Vanc, Cefepime dosing. Pt was reported to be on vancomycin outpt but last dose was 12/6.   Was on HD T-Th-Sat at home but missed Thursdays session.  Urgent HD planned for 12/17 AM.  Unsure of HD schedule while in hospital.   Plan: Vancomycin 1 gm IV X 1 on 12/16 @ 2203 in ED. Vancomycin 500 mg IV X 1 given 12/17 @ 0019 to make total loading dose of 1500 mg.  Vancomycin 750 mg IV Q MWF-HD currently ordered.  Will need to verify HD schedule to confirm this dosing is accurate.   Cefepime 2 gm IV X 1 given in ED on 12/16 2315.  Cefepime 1 gm IV Q24H ordered to continue on 12/17 @ 2300.   Height: 5\' 4"  (162.6 cm) Weight: 75 kg (165 lb 5.5 oz) IBW/kg (Calculated) : 54.7  Temp (24hrs), Avg:98.4 F (36.9 C), Min:98.4 F (36.9 C), Max:98.4 F (36.9 C)  Recent Labs  Lab 12/24/19 1933 12/24/19 2142  WBC 8.1  --   CREATININE 10.50*  --   LATICACIDVEN 1.7 1.6    Estimated Creatinine Clearance: 7.7 mL/min (A) (by C-G formula based on SCr of 10.5 mg/dL (H)).    Allergies  Allergen Reactions  . Enalapril     cough  . Lisinopril Cough    Antimicrobials this admission:   >>    >>   Dose adjustments this admission:   Microbiology results:  BCx:   UCx:    Sputum:    MRSA PCR:   Thank you for allowing pharmacy to be a part of this patient's care.  Mansel Strother D 12/25/2019 1:42 AM

## 2019-12-25 NOTE — Progress Notes (Signed)
[facility]  ROUNDING NOTE   Subjective:   Emergent hemodialysis treatment last night. UF of 3 liters. Patient now breathing room air. States she wants to go home.    Objective:  Vital signs in last 24 hours:  Temp:  [98.4 F (36.9 C)] 98.4 F (36.9 C) (12/17 1301) Pulse Rate:  [86-114] 99 (12/17 1301) Resp:  [0-52] 22 (12/17 1301) BP: (144-209)/(108-143) 185/116 (12/17 1301) SpO2:  [92 %-100 %] 96 % (12/17 1301) Weight:  [75 kg] 75 kg (12/16 1850)  Weight change:  Filed Weights   12/24/19 1850  Weight: 75 kg    Intake/Output: I/O last 3 completed shifts: In: -  Out: 3000 [Other:3000]   Intake/Output this shift:  No intake/output data recorded.  Physical Exam: General: NAD  Head: [place]/AT   Eyes: Anicteric, PERRL  Neck: supple  Lungs:  Right side diminished, clear left  Heart: Regular rate and rhythm  Abdomen:  Soft, nontender, obese  Extremities: no peripheral edema.  Neurologic: Nonfocal, moving all four extremities  Skin: No lesions  Access: LIJ permcath    Basic Metabolic Panel: Recent Labs  Lab 12/24/19 1933  NA 139  K 4.6  CL 100  CO2 23  GLUCOSE 137*  BUN 35*  CREATININE 10.50*  CALCIUM 9.7    Liver Function Tests: Recent Labs  Lab 12/24/19 1933  AST 24  ALT 17  ALKPHOS 110  BILITOT 1.3*  PROT 8.3*  ALBUMIN 3.7   No results for input(s): LIPASE, AMYLASE in the last 168 hours. No results for input(s): AMMONIA in the last 168 hours.  CBC: Recent Labs  Lab 12/24/19 1933  WBC 8.1  NEUTROABS 6.0  HGB 8.4*  HCT 27.6*  MCV 89.0  PLT 284    Cardiac Enzymes: No results for input(s): CKTOTAL, CKMB, CKMBINDEX, TROPONINI in the last 168 hours.  BNP: Invalid input(s): POCBNP  CBG: Recent Labs  Lab 12/25/19 0730  GLUCAP 2    Microbiology: Results for orders placed or performed during the hospital encounter of 12/24/19  Resp Panel by RT-PCR (Flu A&B, Covid) Nasopharyngeal Swab     Status: None   Collection  Time: 12/24/19  9:27 PM   Specimen: Nasopharyngeal Swab; Nasopharyngeal(NP) swabs in vial transport medium  Result Value Ref Range Status   SARS Coronavirus 2 by RT PCR NEGATIVE NEGATIVE Final    Comment: (NOTE) SARS-CoV-2 target nucleic acids are NOT DETECTED.  The SARS-CoV-2 RNA is generally detectable in upper respiratory specimens during the acute phase of infection. The lowest concentration of SARS-CoV-2 viral copies this assay can detect is 138 copies/mL. A negative result does not preclude SARS-Cov-2 infection and should not be used as the sole basis for treatment or other patient management decisions. A negative result may occur with  improper specimen collection/handling, submission of specimen other than nasopharyngeal swab, presence of viral mutation(s) within the areas targeted by this assay, and inadequate number of viral copies(<138 copies/mL). A negative result must be combined with clinical observations, patient history, and epidemiological information. The expected result is Negative.  Fact Sheet for Patients:  EntrepreneurPulse.com.au  Fact Sheet for Healthcare Providers:  IncredibleEmployment.be  This test is no t yet approved or cleared by the [place] FDA and  has been authorized for detection and/or diagnosis of SARS-CoV-2 by FDA under an Emergency Use Authorization (EUA). This EUA will remain  in effect (meaning this test can be used) for the duration of the COVID-19 declaration under Section 564(b)(1) of the Act, 21  U.S.C.section 360bbb-3(b)(1), unless the authorization is terminated  or revoked sooner.       Influenza A by PCR NEGATIVE NEGATIVE Final   Influenza B by PCR NEGATIVE NEGATIVE Final    Comment: (NOTE) The Xpert Xpress SARS-CoV-2/FLU/RSV plus assay is intended as an aid in the diagnosis of influenza from Nasopharyngeal swab specimens and should not be used as a sole basis for treatment. Nasal washings  and aspirates are unacceptable for Xpert Xpress SARS-CoV-2/FLU/RSV testing.  Fact Sheet for Patients: EntrepreneurPulse.com.au  Fact Sheet for Healthcare Providers: IncredibleEmployment.be  This test is not yet approved or cleared by the [place] FDA and has been authorized for detection and/or diagnosis of SARS-CoV-2 by FDA under an Emergency Use Authorization (EUA). This EUA will remain in effect (meaning this test can be used) for the duration of the COVID-19 declaration under Section 564(b)(1) of the Act, 21 U.S.C. section 360bbb-3(b)(1), unless the authorization is terminated or revoked.  Performed at [facility], [address] [place], [place] [address]   MRSA PCR Screening     Status: None   Collection Time: 12/24/19  9:27 PM   Specimen: Nasal Mucosa; Nasopharyngeal  Result Value Ref Range Status   MRSA by PCR NEGATIVE NEGATIVE Final    Comment:        The GeneXpert MRSA Assay (FDA approved for NASAL specimens only), is one component of a comprehensive MRSA colonization surveillance program. It is not intended to diagnose MRSA infection nor to guide or monitor treatment for MRSA infections. Performed at [facility], [address] [place], [place] [address]     Coagulation Studies: Recent Labs    12/25/19 0012  LABPROT 13.9  INR 1.1    Urinalysis: No results for input(s): COLORURINE, LABSPEC, PHURINE, GLUCOSEU, HGBUR, BILIRUBINUR, KETONESUR, PROTEINUR, UROBILINOGEN, NITRITE, LEUKOCYTESUR in the last 72 hours.  Invalid input(s): APPERANCEUR    Imaging: DG Chest 2 View  Result Date: 12/24/2019 CLINICAL DATA:  Dyspnea EXAM: CHEST - 2 VIEW COMPARISON:  07/07/2019 FINDINGS: Small to moderate right pleural effusion is present. There is mild compressive atelectasis of the right lung base. There is perihilar interstitial pulmonary infiltrate, asymmetrically more severe within the right lung,  suspicious for mild interstitial pulmonary edema, possibly cardiogenic in nature. No confluent pulmonary infiltrate. No pneumothorax. Mild cardiomegaly is present, new from prior examination. Left internal jugular hemodialysis catheter is seen with its tips within the superior vena cava. IMPRESSION: Interval development of mild cardiomegaly, mild interstitial pulmonary edema, possibly cardiogenic in nature, and small to moderate right pleural effusion. Interval exchange of hemodialysis catheter with split tip catheter tips noted within the superior vena cava. Electronically Signed   By: Fidela Salisbury MD   On: 12/24/2019 20:44     Medications:   . ceFEPime (MAXIPIME) IV     . amLODipine  10 mg Oral Daily  . carvedilol  25 mg Oral BID WC  . Chlorhexidine Gluconate Cloth  6 each Topical Q0600  . hydrALAZINE  100 mg Oral Q8H  . insulin aspart  0-5 Units Subcutaneous QHS  . insulin aspart  0-6 Units Subcutaneous TID WC  . losartan  100 mg Oral Daily     Assessment/ Plan:  [name] is a 29 y.o. black female with end stage renal disease on hemodialysis, hypertension, CVA with residual right sided weakness, diabetes mellitus type II insulin dependent, history of preeclampsia who is admitted to [facility] on 12/24/2019 for Acute respiratory failure with hypoxia ([place]) [J96.01]  [facility]  MWF [facility] Left IJ permcath 74kg  1. End Stage Renal Disease: emergent hemodialysis last night. No acute indication for further dialysis today. Asked patient to stay one more day so we can assess her respiratory status. However patient is adamant about leaving.   2. Hypertension: with urgency. Not well controlled.  Home regimen of hydralazine, amlodipine, carvedilol and losartan  3. Anemia of chronic kidney disease: hemoglobin 8.4. Hold ESA due to hypertension.   4. Secondary Hyperparathyroidism with hyperphosphatemia. PTH from 12/13 was elevated at 1628.  - resume calcium acetate with meals.      LOS: 1 Majesti Gambrell 12/17/20211:46 PM

## 2019-12-25 NOTE — ED Notes (Signed)
D/C instructions given.  All questions addressed.  Pt left ER via w/c, left with friend/family member.

## 2019-12-25 NOTE — Care Management CC44 (Signed)
Condition Code 44 Documentation Completed  Patient Details  Name: Joyce West MRN: [id number] Date of Birth: [birth year]   Condition Code 44 given:  Yes Patient signature on Condition Code 44 notice:  Yes Documentation of 2 MD's agreement:  Yes Code 44 added to claim:  Yes    Adelene Amas, [place] 12/25/2019, 12:39 PM

## 2019-12-25 NOTE — ED Notes (Signed)
Attempted iv access no success

## 2019-12-25 NOTE — ED Notes (Signed)
Per RT, patient is refusing to wear her bipap or O2.  Lowest saturation level has been at 86%.  Admission MD and lead RN informed.

## 2019-12-25 NOTE — Care Management Obs Status (Signed)
[facility] NOTIFICATION   Patient Details  Name: Joyce West MRN: [id number] Date of Birth: 01/31/1990   Medicare Observation Status Notification Given:  Yes    Ova Freshwater 12/25/2019, 12:38 PM

## 2019-12-25 NOTE — ED Notes (Signed)
Pt given snack of peanut butter and graham crackers.

## 2019-12-25 NOTE — Progress Notes (Signed)
Upon assessment of catheter, Arterial  port will not aspirate but will flush.  Venous port patent.   Dressing  gauze, and peeling edges, no biodisc. Dressing changed and  Treatment started 245

## 2019-12-25 NOTE — ED Notes (Signed)
Patient aware of need for urine specimen. States urinates very infrequently and typically only one first thing in the morning.

## 2019-12-25 NOTE — ED Notes (Signed)
Pt continues to refuse to wear oxygen.  States she is wanting to leave AMA.  Strongly advised pt not to, discussed risks and dangers.  Pt states she is well aware.  And if the dr. Does not come soon, she is leaving.

## 2019-12-25 NOTE — ED Notes (Signed)
Pt advised that she was going to be discharged soon,

## 2019-12-25 NOTE — ED Notes (Addendum)
Dialysis complete and dialysis RN leaving bedside. Dialysis RN reports removed 3.5L of fluid. Patient remains hypertensive.  Patient asleep with Bipap and cardiac monitor in place. Sleeping peacefully. Breathing easy and unlabored with symmetric chest rise and fall. Bed low and locked with siderails raised x2 and call bell in reach.

## 2019-12-25 NOTE — Progress Notes (Signed)
Pharmacy Antibiotic Note  Joyce West is a 30 y.o. female admitted on 12/24/2019 with HCAP.  Pharmacy has been consulted for Vanc, Cefepime dosing. Pt was reported to be on vancomycin outpt but last dose was 12/6.   Was on HD T-Th-Sat at home but missed Thursdays session.    Urgent HD planned for 12/17 AM.  Unsure of HD schedule while in hospital.   Plan: Vancomycin 1 gm IV X 1 on 12/16 @ 2203 in ED. Vancomycin 500 mg IV X 1 given 12/17 @ 0019 to make total loading dose of 1500 mg.   Vancomycin 750 mg IV Q MWF-HD currently ordered - cancelled order - will check random vancomycin and dose per level based on prior to admit trough of 24 and unclear HD plan     Cefepime 2 gm IV X 1 given in ED on 12/16 2315.   Continue Cefepime 1 gm IV Q24H ordered to continue on 12/17 @ 2300.   Height: 5\' 4"  (162.6 cm) Weight: 75 kg (165 lb 5.5 oz) IBW/kg (Calculated) : 54.7  Temp (24hrs), Avg:98.4 F (36.9 C), Min:98.4 F (36.9 C), Max:98.4 F (36.9 C)  Recent Labs  Lab 12/24/19 1933 12/24/19 2142  WBC 8.1  --   CREATININE 10.50*  --   LATICACIDVEN 1.7 1.6    Estimated Creatinine Clearance: 7.7 mL/min (A) (by C-G formula based on SCr of 10.5 mg/dL (H)).    Allergies  Allergen Reactions  . Enalapril     cough  . Lisinopril Cough    Antimicrobials this admission: Vancomycin 12/16 >> Cefepime 12/16 >>  Dose adjustments this admission: N/A  Microbiology results:  BCx: pending  MRSA PCR: NEG COVID/FLU NEG  Thank you for allowing pharmacy to be a part of this patient's care.  Lu Duffel, PharmD, BCPS Clinical Pharmacist 12/25/2019 7:45 AM

## 2019-12-25 NOTE — Discharge Summary (Signed)
Physician Discharge Summary  Oniya Mandarino ZHG:[id number] DOB: [birth year] DOA: 12/24/2019  PCP: Pcp, No  Admit date: 12/24/2019 Discharge date: 12/25/2019  Admitted From: home Disposition: home  Recommendations for Outpatient Follow-up:  1. Follow up with PCP in 1-2 weeks 2. F/u w/ nephro, Dr. Juleen China in 1-2 days  Home Health: no  Equipment/Devices:  Discharge Condition: stable  CODE STATUS: full  Diet recommendation: carb modified/renal diet  Brief/Interim Summary: HPI was taken from Dr. Jonelle Sidle: Nettye Flegal is a 29 y.o. female with medical history significant of end-stage renal disease on hemodialysis as stated May of this year, diabetes, hypertension, history of CVA, recent MRSA pneumonia and bacteremia, medication noncompliance who was recently admitted at an outside hospital with MRSA pneumonia and bacteremia.  Post discharge she was supposed to get multiple weeks of vancomycin during hemodialysis but per patient she has not received much.  Came to the ER today in significant respiratory distress.  She was hypoxic and placed on BiPAP.  Patient noted to have significant pulmonary findings consistent with pulmonary edema with possible pneumonia.  She has missed her hemodialysis today.  Patient being admitted with acute on chronic respiratory failure with hypoxemia probably due to pneumonia and or pulmonary edema..  ED Course: Temperature 98.4 blood pressure 209/143, pulse 114 respiratory rate 52 and oxygen sat 96% on room air.  White count 8.1 hemoglobin 8.4 and platelets 284.  Sodium is 139 potassium 4.6 chloride 100 CO2 23 BUN 35 creatinine 10.5 and calcium 9.7.  Lactic acid 1.7.  BNP 3510.  Troponin is 81.  Acute viral screen so far negative.  MRSA screen by PCR is negative.  Chest x-ray showed cardiomegaly with mild interstitial pulmonary edema and small to moderate right pleural effusion.  Patient being admitted with acute respiratory failure most likely due to pulmonary  edema.  Hospital Course from Dr. Jimmye Norman 12/25/19: Pt presented w/ shortness of breath after missing one session of HD. Pt was found to have pulmonary edema that resolved after 3.5L was removed w/ HD. Pt initially required supplemental oxygen but it was able to weaned prior to d/c. Pt received education on the importance of going to all HD sessions. Pt verbalized her understanding. Of note, pt will continue to get IV vanco on HD days until her course is complete. IV vanco has been held at previous HD sessions as vanco trough has been high. IV vanco was started at [facility]. Pt was non-toxic appearing. For more information, please see previous progress/consult notes.    Discharge Diagnoses:  Principal Problem:   Acute respiratory failure with hypoxia (HCC) Active Problems:   Hypertension   Type 2 diabetes mellitus with stage 3 chronic kidney disease (HCC)   ESRD (end stage renal disease) ([place])   MRSA pneumonia ([place]) Acute hypoxic respiratory failure: likely pulmonary edema as per CXR. BNP was significantly elevated. Neg MRSA PCR from nasal cavity. Will continue w/ IV vanco on HD days until course is complete which was started at [facility]  ESRD: HD MWF. 3.5L removed w/ HD.   DM2: poorly controlled. Continue on SSI w/ accuchecks   Malignant hypertension: will restart home dose of carvedilol, amlodipine, losartan, hydralazine.  Noncompliance: education on importance attending all HD sessions    Discharge Instructions  Discharge Instructions    Diet - low sodium heart healthy   Complete by: As directed    As well as a renal diet   Discharge instructions   Complete by: As directed    F/u w/ nephro, Dr.  Kolluru in 1-2 days. F/u w/ ortho surg within 1 week for the back pain (you will need to call to reschedule the appointment.)   Increase activity slowly   Complete by: As directed    No wound care   Complete by: As directed      Allergies as of 12/25/2019      Reactions   Enalapril     cough   Lisinopril Cough      Medication List    TAKE these medications   albuterol 108 (90 Base) MCG/ACT inhaler Commonly known as: VENTOLIN HFA Inhale 2 puffs into the lungs every 4 (four) hours as needed.   amLODipine 10 MG tablet Commonly known as: NORVASC Take 10 mg by mouth daily.   aspirin 81 MG chewable tablet Chew by mouth daily.   atorvastatin 80 MG tablet Commonly known as: LIPITOR Take 80 mg by mouth daily. Patient does not know what does she takes   calcium acetate 667 MG capsule Commonly known as: PHOSLO Take 1,334 mg by mouth 3 (three) times daily.   carvedilol 25 MG tablet Commonly known as: COREG Take 25 mg by mouth 2 (two) times daily.   gabapentin 300 MG capsule Commonly known as: NEURONTIN Take 300 mg by mouth daily.   gentamicin cream 0.1 % Commonly known as: GARAMYCIN Apply 1 application topically 2 (two) times daily.   guaiFENesin-codeine 100-10 MG/5ML syrup Take by mouth.   hydrALAZINE 100 MG tablet Commonly known as: APRESOLINE Take 100 mg by mouth 3 (three) times daily.   hydrOXYzine 25 MG tablet Commonly known as: ATARAX/VISTARIL Take 25 mg by mouth every 12 (twelve) hours as needed.   liraglutide 18 MG/3ML Sopn Commonly known as: VICTOZA Inject 1.8 mg into the skin daily.   losartan 100 MG tablet Commonly known as: COZAAR Take 100 mg by mouth daily.   traMADol 50 MG tablet Commonly known as: ULTRAM Take 1 tablet (50 mg total) by mouth every 6 (six) hours as needed.       Allergies  Allergen Reactions  . Enalapril     cough  . Lisinopril Cough    Consultations: Nephro,  Procedures/Studies: DG Chest 2 View  Result Date: 12/24/2019 CLINICAL DATA:  Dyspnea EXAM: CHEST - 2 VIEW COMPARISON:  07/07/2019 FINDINGS: Small to moderate right pleural effusion is present. There is mild compressive atelectasis of the right lung base. There is perihilar interstitial pulmonary infiltrate, asymmetrically more severe within the  right lung, suspicious for mild interstitial pulmonary edema, possibly cardiogenic in nature. No confluent pulmonary infiltrate. No pneumothorax. Mild cardiomegaly is present, new from prior examination. Left internal jugular hemodialysis catheter is seen with its tips within the superior vena cava. IMPRESSION: Interval development of mild cardiomegaly, mild interstitial pulmonary edema, possibly cardiogenic in nature, and small to moderate right pleural effusion. Interval exchange of hemodialysis catheter with split tip catheter tips noted within the superior vena cava. Electronically Signed   By: Fidela Salisbury MD   On: 12/24/2019 20:44     Subjective: Pt c/o back pain which has been a chronic problem for the pt.   Discharge Exam: Vitals:   12/25/19 1300 12/25/19 1301  BP: (!) 185/116 (!) 185/116  Pulse: 99 99  Resp: (!) 22 (!) 22  Temp: 98.4 F (36.9 C) 98.4 F (36.9 C)  SpO2: 96% 96%   Vitals:   12/25/19 0715 12/25/19 1202 12/25/19 1300 12/25/19 1301  BP: (!) 168/120 (!) 179/119 (!) 185/116 (!) 185/116  Pulse: 95 Marland Kitchen)  110 99 99  Resp: (!) 26 (!) 24 (!) 22 (!) 22  Temp:  98.4 F (36.9 C) 98.4 F (36.9 C) 98.4 F (36.9 C)  TempSrc:   Oral Oral  SpO2: 100% 95% 96% 96%  Weight:      Height:        General: Pt is alert, awake, not in acute distress. Appears agitated & frustrated  Cardiovascular: S1/S2 +, no rubs, no gallops Respiratory: CTA bilaterally, no wheezing, no rhonchi Abdominal: Soft, NT, ND, bowel sounds + Extremities:  no cyanosis    The results of significant diagnostics from this hospitalization (including imaging, microbiology, ancillary and laboratory) are listed below for reference.     Microbiology: Recent Results (from the past 240 hour(s))  Resp Panel by RT-PCR (Flu A&B, Covid) Nasopharyngeal Swab     Status: None   Collection Time: 12/24/19  9:27 PM   Specimen: Nasopharyngeal Swab; Nasopharyngeal(NP) swabs in vial transport medium  Result Value Ref  Range Status   SARS Coronavirus 2 by RT PCR NEGATIVE NEGATIVE Final    Comment: (NOTE) SARS-CoV-2 target nucleic acids are NOT DETECTED.  The SARS-CoV-2 RNA is generally detectable in upper respiratory specimens during the acute phase of infection. The lowest concentration of SARS-CoV-2 viral copies this assay can detect is 138 copies/mL. A negative result does not preclude SARS-Cov-2 infection and should not be used as the sole basis for treatment or other patient management decisions. A negative result may occur with  improper specimen collection/handling, submission of specimen other than nasopharyngeal swab, presence of viral mutation(s) within the areas targeted by this assay, and inadequate number of viral copies(<138 copies/mL). A negative result must be combined with clinical observations, patient history, and epidemiological information. The expected result is Negative.  Fact Sheet for Patients:  EntrepreneurPulse.com.au  Fact Sheet for Healthcare Providers:  IncredibleEmployment.be  This test is no t yet approved or cleared by the [place] FDA and  has been authorized for detection and/or diagnosis of SARS-CoV-2 by FDA under an Emergency Use Authorization (EUA). This EUA will remain  in effect (meaning this test can be used) for the duration of the COVID-19 declaration under Section 564(b)(1) of the Act, 21 U.S.C.section 360bbb-3(b)(1), unless the authorization is terminated  or revoked sooner.       Influenza A by PCR NEGATIVE NEGATIVE Final   Influenza B by PCR NEGATIVE NEGATIVE Final    Comment: (NOTE) The Xpert Xpress SARS-CoV-2/FLU/RSV plus assay is intended as an aid in the diagnosis of influenza from Nasopharyngeal swab specimens and should not be used as a sole basis for treatment. Nasal washings and aspirates are unacceptable for Xpert Xpress SARS-CoV-2/FLU/RSV testing.  Fact Sheet for  Patients: EntrepreneurPulse.com.au  Fact Sheet for Healthcare Providers: IncredibleEmployment.be  This test is not yet approved or cleared by the [place] FDA and has been authorized for detection and/or diagnosis of SARS-CoV-2 by FDA under an Emergency Use Authorization (EUA). This EUA will remain in effect (meaning this test can be used) for the duration of the COVID-19 declaration under Section 564(b)(1) of the Act, 21 U.S.C. section 360bbb-3(b)(1), unless the authorization is terminated or revoked.  Performed at [facility], [address] [place], [place] [address]   MRSA PCR Screening     Status: None   Collection Time: 12/24/19  9:27 PM   Specimen: Nasal Mucosa; Nasopharyngeal  Result Value Ref Range Status   MRSA by PCR NEGATIVE NEGATIVE Final    Comment:  The GeneXpert MRSA Assay (FDA approved for NASAL specimens only), is one component of a comprehensive MRSA colonization surveillance program. It is not intended to diagnose MRSA infection nor to guide or monitor treatment for MRSA infections. Performed at [facility], [address] [place], [place] [address]      Labs: BNP (last 3 results) Recent Labs    12/24/19 1933  BNP 9,024.0*   Basic Metabolic Panel: Recent Labs  Lab 12/24/19 1933  NA 139  K 4.6  CL 100  CO2 23  GLUCOSE 137*  BUN 35*  CREATININE 10.50*  CALCIUM 9.7   Liver Function Tests: Recent Labs  Lab 12/24/19 1933  AST 24  ALT 17  ALKPHOS 110  BILITOT 1.3*  PROT 8.3*  ALBUMIN 3.7   No results for input(s): LIPASE, AMYLASE in the last 168 hours. No results for input(s): AMMONIA in the last 168 hours. CBC: Recent Labs  Lab 12/24/19 1933  WBC 8.1  NEUTROABS 6.0  HGB 8.4*  HCT 27.6*  MCV 89.0  PLT 284   Cardiac Enzymes: No results for input(s): CKTOTAL, CKMB, CKMBINDEX, TROPONINI in the last 168 hours. BNP: Invalid input(s): POCBNP CBG: Recent  Labs  Lab 12/25/19 0730  GLUCAP 89   D-Dimer No results for input(s): DDIMER in the last 72 hours. Hgb A1c Recent Labs    12/25/19 0337  HGBA1C 5.1   Lipid Profile No results for input(s): CHOL, HDL, LDLCALC, TRIG, CHOLHDL, LDLDIRECT in the last 72 hours. Thyroid function studies No results for input(s): TSH, T4TOTAL, T3FREE, THYROIDAB in the last 72 hours.  Invalid input(s): FREET3 Anemia work up No results for input(s): VITAMINB12, FOLATE, FERRITIN, TIBC, IRON, RETICCTPCT in the last 72 hours. Urinalysis    Component Value Date/Time   COLORURINE YELLOW (A) 04/28/2019 1435   APPEARANCEUR HAZY (A) 04/28/2019 1435   APPEARANCEUR Hazy 10/15/2013 1506   LABSPEC 1.014 04/28/2019 1435   LABSPEC 1.024 10/15/2013 1506   PHURINE 6.0 04/28/2019 1435   GLUCOSEU 150 (A) 04/28/2019 1435   GLUCOSEU >=500 10/15/2013 1506   HGBUR NEGATIVE 04/28/2019 1435   BILIRUBINUR NEGATIVE 04/28/2019 1435   BILIRUBINUR Negative 10/15/2013 1506   KETONESUR NEGATIVE 04/28/2019 1435   PROTEINUR >=300 (A) 04/28/2019 1435   UROBILINOGEN 0.2 03/15/2008 1619   NITRITE NEGATIVE 04/28/2019 1435   LEUKOCYTESUR NEGATIVE 04/28/2019 1435   LEUKOCYTESUR Negative 10/15/2013 1506   Sepsis Labs Invalid input(s): PROCALCITONIN,  WBC,  LACTICIDVEN Microbiology Recent Results (from the past 240 hour(s))  Resp Panel by RT-PCR (Flu A&B, Covid) Nasopharyngeal Swab     Status: None   Collection Time: 12/24/19  9:27 PM   Specimen: Nasopharyngeal Swab; Nasopharyngeal(NP) swabs in vial transport medium  Result Value Ref Range Status   SARS Coronavirus 2 by RT PCR NEGATIVE NEGATIVE Final    Comment: (NOTE) SARS-CoV-2 target nucleic acids are NOT DETECTED.  The SARS-CoV-2 RNA is generally detectable in upper respiratory specimens during the acute phase of infection. The lowest concentration of SARS-CoV-2 viral copies this assay can detect is 138 copies/mL. A negative result does not preclude SARS-Cov-2 infection  and should not be used as the sole basis for treatment or other patient management decisions. A negative result may occur with  improper specimen collection/handling, submission of specimen other than nasopharyngeal swab, presence of viral mutation(s) within the areas targeted by this assay, and inadequate number of viral copies(<138 copies/mL). A negative result must be combined with clinical observations, patient history, and epidemiological information. The expected result is  Negative.  Fact Sheet for Patients:  EntrepreneurPulse.com.au  Fact Sheet for Healthcare Providers:  IncredibleEmployment.be  This test is no t yet approved or cleared by the [place] FDA and  has been authorized for detection and/or diagnosis of SARS-CoV-2 by FDA under an Emergency Use Authorization (EUA). This EUA will remain  in effect (meaning this test can be used) for the duration of the COVID-19 declaration under Section 564(b)(1) of the Act, 21 U.S.C.section 360bbb-3(b)(1), unless the authorization is terminated  or revoked sooner.       Influenza A by PCR NEGATIVE NEGATIVE Final   Influenza B by PCR NEGATIVE NEGATIVE Final    Comment: (NOTE) The Xpert Xpress SARS-CoV-2/FLU/RSV plus assay is intended as an aid in the diagnosis of influenza from Nasopharyngeal swab specimens and should not be used as a sole basis for treatment. Nasal washings and aspirates are unacceptable for Xpert Xpress SARS-CoV-2/FLU/RSV testing.  Fact Sheet for Patients: EntrepreneurPulse.com.au  Fact Sheet for Healthcare Providers: IncredibleEmployment.be  This test is not yet approved or cleared by the [place] FDA and has been authorized for detection and/or diagnosis of SARS-CoV-2 by FDA under an Emergency Use Authorization (EUA). This EUA will remain in effect (meaning this test can be used) for the duration of the COVID-19 declaration  under Section 564(b)(1) of the Act, 21 U.S.C. section 360bbb-3(b)(1), unless the authorization is terminated or revoked.  Performed at [facility], [address] [place], [place] [address]   MRSA PCR Screening     Status: None   Collection Time: 12/24/19  9:27 PM   Specimen: Nasal Mucosa; Nasopharyngeal  Result Value Ref Range Status   MRSA by PCR NEGATIVE NEGATIVE Final    Comment:        The GeneXpert MRSA Assay (FDA approved for NASAL specimens only), is one component of a comprehensive MRSA colonization surveillance program. It is not intended to diagnose MRSA infection nor to guide or monitor treatment for MRSA infections. Performed at [facility], [address], [place], [place] [address]      Time coordinating discharge: Over 30 minutes  SIGNED:   Wyvonnia Dusky, MD  [facility] 12/25/2019, 3:24 PM Pager   If 7PM-7AM, please contact night-coverage

## 2019-12-26 LAB — HEPATITIS B SURFACE ANTIBODY, QUANTITATIVE: Hep B S AB Quant (Post): >1000 m[IU]/mL (ref >9.9)

## 2019-12-26 LAB — HEPATITIS B DNA, ULTRAQUANTITATIVE, PCR
HBV DNA SERPL PCR-ACNC: NOT DETECTED IU/mL
HBV DNA SERPL PCR-LOG IU: UNDETERMINED log10 IU/mL

## 2019-12-29 LAB — CULTURE, BLOOD (ROUTINE X 2)
Culture: NO GROWTH
Culture: NO GROWTH
Special Requests: ADEQUATE

## 2020-01-12 ENCOUNTER — Ambulatory Visit (INDEPENDENT_AMBULATORY_CARE_PROVIDER_SITE_OTHER): Payer: Medicare Other | Admitting: Vascular Surgery

## 2020-01-12 ENCOUNTER — Ambulatory Visit (INDEPENDENT_AMBULATORY_CARE_PROVIDER_SITE_OTHER): Payer: Medicare Other

## 2020-01-12 ENCOUNTER — Encounter (INDEPENDENT_AMBULATORY_CARE_PROVIDER_SITE_OTHER): Payer: Self-pay | Admitting: Vascular Surgery

## 2020-01-12 ENCOUNTER — Other Ambulatory Visit: Payer: Self-pay

## 2020-01-12 ENCOUNTER — Encounter (INDEPENDENT_AMBULATORY_CARE_PROVIDER_SITE_OTHER): Payer: Self-pay

## 2020-01-12 VITALS — BP 199/131 | HR 100 | Resp 20 | Ht 64.0 in | Wt 152.0 lb

## 2020-01-12 DIAGNOSIS — N186 End stage renal disease: Secondary | ICD-10-CM

## 2020-01-12 DIAGNOSIS — I10 Essential (primary) hypertension: Secondary | ICD-10-CM

## 2020-01-12 DIAGNOSIS — Z792 Long term (current) use of antibiotics: Secondary | ICD-10-CM | POA: Insufficient documentation

## 2020-01-12 DIAGNOSIS — Z992 Dependence on renal dialysis: Secondary | ICD-10-CM

## 2020-01-12 DIAGNOSIS — T80211A Bloodstream infection due to central venous catheter, initial encounter: Secondary | ICD-10-CM | POA: Insufficient documentation

## 2020-01-12 DIAGNOSIS — E1122 Type 2 diabetes mellitus with diabetic chronic kidney disease: Secondary | ICD-10-CM | POA: Diagnosis not present

## 2020-01-12 NOTE — H&P (View-Only) (Signed)
MRN : [id number]  Joyce West is a 30 y.o. (1990-06-18) female who presents with chief complaint of  Chief Complaint  Patient presents with  . New Patient (Initial Visit)    Vein mapping  .  History of Present Illness: Patient returns today in follow up for evaluation of dialysis access.  We saw her in the hospital several months ago and placed a PermCath.  We had not seen her in the office.  She had a vein mapping done at [facility] which I have independently reviewed which showed adequate cephalic vein throughout both upper extremities.  She is left-hand dominant.  She is currently getting dialysis through a left jugular PermCath on Mondays, Wednesdays, and Fridays.  She is doing well with this, but it is her third PermCath already in the dialysis center is very anxious for her to get her extremity access.  Current Outpatient Medications  Medication Sig Dispense Refill  . albuterol (VENTOLIN HFA) 108 (90 Base) MCG/ACT inhaler Inhale 2 puffs into the lungs every 4 (four) hours as needed.    Marland Kitchen amLODipine (NORVASC) 10 MG tablet Take 10 mg by mouth daily.    Marland Kitchen atorvastatin (LIPITOR) 80 MG tablet Take 80 mg by mouth daily. Patient does not know what does she takes    . calcium acetate (PHOSLO) 667 MG capsule Take 1,334 mg by mouth 3 (three) times daily.    . carvedilol (COREG) 25 MG tablet Take 25 mg by mouth 2 (two) times daily.    . furosemide (LASIX) 40 MG tablet Take 40 mg by mouth 3 (three) times daily.    Marland Kitchen gabapentin (NEURONTIN) 300 MG capsule Take 300 mg by mouth daily.    . hydrALAZINE (APRESOLINE) 100 MG tablet Take 100 mg by mouth 3 (three) times daily.    . hydrOXYzine (ATARAX/VISTARIL) 25 MG tablet Take 25 mg by mouth every 12 (twelve) hours as needed.    Marland Kitchen losartan (COZAAR) 100 MG tablet Take 100 mg by mouth daily.    . melatonin 3 MG TABS tablet Take by mouth.    . polyethylene glycol (MIRALAX / GLYCOLAX) 17 g packet Take by mouth.    Marland Kitchen aspirin 81 MG chewable tablet Chew by mouth  daily. (Patient not taking: No sig reported)    . gentamicin cream (GARAMYCIN) 0.1 % Apply 1 application topically 2 (two) times daily. (Patient not taking: Reported on 01/12/2020)    . guaiFENesin-codeine 100-10 MG/5ML syrup Take by mouth. (Patient not taking: Reported on 01/12/2020)    . liraglutide (VICTOZA) 18 MG/3ML SOPN Inject 1.8 mg into the skin daily.  (Patient not taking: Reported on 01/12/2020)    . traMADol (ULTRAM) 50 MG tablet Take 1 tablet (50 mg total) by mouth every 6 (six) hours as needed. (Patient not taking: Reported on 01/12/2020) 15 tablet 0   No current facility-administered medications for this visit.    Past Medical History:  Diagnosis Date  . Diabetes mellitus without complication ([place])   . Hypertension   . Ischemic stroke ([place])   . Renal disorder    ESRD, dialysis since may 2021  . Right sided weakness    secondary to stroke     Past Surgical History:  Procedure Laterality Date  . CESAREAN SECTION     x 3  . DIALYSIS/PERMA CATHETER INSERTION N/A 05/29/2019   Procedure: DIALYSIS/PERMA CATHETER INSERTION;  Surgeon: Algernon Huxley, MD;  Location: [facility];  Service: Cardiovascular;  Laterality: N/A;  . DIALYSIS/PERMA CATHETER  INSERTION N/A 11/02/2019   Procedure: DIALYSIS/PERMA CATHETER INSERTION;  Surgeon: Algernon Huxley, MD;  Location: [place] [facility];  Service: Cardiovascular;  Laterality: N/A;  . TUBAL LIGATION       Social History   Tobacco Use  . Smoking status: Current Every Day Smoker    Types: Cigarettes  . Smokeless tobacco: Never Used  Substance Use Topics  . Alcohol use: Not Currently  . Drug use: Never       Family History No bleeding disorders, clotting disorders, aneurysms, or autoimmune diseases  Allergies  Allergen Reactions  . Enalapril     cough  . Lisinopril Cough     REVIEW OF SYSTEMS (Negative unless checked)  Constitutional: [] Weight loss  [] Fever  [] Chills Cardiac: [] Chest pain   [] Chest pressure    [] Palpitations   [] Shortness of breath when laying flat   [] Shortness of breath at rest   [] Shortness of breath with exertion. Vascular:  [] Pain in legs with walking   [] Pain in legs at rest   [] Pain in legs when laying flat   [] Claudication   [] Pain in feet when walking  [] Pain in feet at rest  [] Pain in feet when laying flat   [] History of DVT   [] Phlebitis   [] Swelling in legs   [] Varicose veins   [] Non-healing ulcers Pulmonary:   [] Uses home oxygen   [] Productive cough   [] Hemoptysis   [] Wheeze  [] COPD   [] Asthma Neurologic:  [] Dizziness  [] Blackouts   [] Seizures   [] History of stroke   [] History of TIA  [] Aphasia   [] Temporary blindness   [] Dysphagia   [] Weakness or numbness in arms   [] Weakness or numbness in legs Musculoskeletal:  [] Arthritis   [] Joint swelling   [] Joint pain   [] Low back pain Hematologic:  [] Easy bruising  [] Easy bleeding   [] Hypercoagulable state   [x] Anemic   Gastrointestinal:  [] Blood in stool   [] Vomiting blood  [] Gastroesophageal reflux/heartburn   [] Abdominal pain Genitourinary:  [x] Chronic kidney disease   [] Difficult urination  [] Frequent urination  [] Burning with urination   [] Hematuria Skin:  [] Rashes   [] Ulcers   [] Wounds Psychological:  [] History of anxiety   []  History of major depression.  Physical Examination  BP (!) 199/131 (BP Location: Left Arm)   Pulse 100   Resp 20   Ht 5\' 4"  (1.626 m)   Wt 152 lb (68.9 kg)   BMI 26.09 kg/m  Gen:  WD/WN, NAD Head: [place]/AT, No temporalis wasting. Ear/Nose/Throat: Hearing grossly intact, nares w/o erythema or drainage Eyes: Conjunctiva clear. Sclera non-icteric Neck: Supple.  Trachea midline Pulmonary:  Good air movement, no use of accessory muscles.  Cardiac: RRR, no JVD Vascular: Allen's test normal bilaterally. Left jugular permcath in place without erythema or drainage. Vessel Right Left  Radial Palpable Palpable                   Musculoskeletal: M/S 5/5 throughout.  No deformity or  atrophy. Neurologic: Sensation grossly intact in extremities.  Symmetrical.  Speech is fluent.  Psychiatric: Judgment intact, Mood & affect appropriate for pt's clinical situation. Dermatologic: No rashes or ulcers noted.  No cellulitis or open wounds.       Labs Recent Results (from the past 2160 hour(s))  [facility] vascular lab only)     Status: None   Collection Time: 11/02/19 12:00 PM  Result Value Ref Range   [facility] vascular lab) 4.2 3.5 - 5.1    Comment: Performed at [facility], [address] [place], [place] [address]  Glucose, capillary     Status: Abnormal   Collection Time: 11/02/19 12:10 PM  Result Value Ref Range   Glucose-Capillary 202 (H) 70 - 99 mg/dL    Comment: Glucose reference range applies only to samples taken after fasting for at least 8 hours.  Glucose, capillary     Status: Abnormal   Collection Time: 11/02/19  1:49 PM  Result Value Ref Range   Glucose-Capillary 175 (H) 70 - 99 mg/dL    Comment: Glucose reference range applies only to samples taken after fasting for at least 8 hours.  Lactic acid, plasma     Status: None   Collection Time: 12/24/19  7:33 PM  Result Value Ref Range   Lactic Acid, Venous 1.7 0.5 - 1.9 mmol/L    Comment: Performed at [facility], [address] [place], [place] [address]  Comprehensive metabolic panel     Status: Abnormal   Collection Time: 12/24/19  7:33 PM  Result Value Ref Range   Sodium 139 135 - 145 mmol/L   Potassium 4.6 3.5 - 5.1 mmol/L   Chloride 100 98 - 111 mmol/L   CO2 23 22 - 32 mmol/L   Glucose, Bld 137 (H) 70 - 99 mg/dL    Comment: Glucose reference range applies only to samples taken after fasting for at least 8 hours.   BUN 35 (H) 6 - 20 mg/dL   Creatinine, Ser 10.50 (H) 0.44 - 1.00 mg/dL   Calcium 9.7 8.9 - 10.3 mg/dL   Total Protein 8.3 (H) 6.5 - 8.1 g/dL   Albumin 3.7 3.5 - 5.0 g/dL   AST 24 15 - 41 U/L   ALT 17 0 - 44 U/L   Alkaline Phosphatase 110 38 -  126 U/L   Total Bilirubin 1.3 (H) 0.3 - 1.2 mg/dL   GFR, Estimated 5 (L) >60 mL/min    Comment: (NOTE) Calculated using the CKD-EPI Creatinine Equation (2021)    Anion gap 16 (H) 5 - 15    Comment: Performed at [facility], [address] [place], [place] [address]  CBC with Differential     Status: Abnormal   Collection Time: 12/24/19  7:33 PM  Result Value Ref Range   WBC 8.1 4.0 - 10.5 K/uL   RBC 3.10 (L) 3.87 - 5.11 MIL/uL   Hemoglobin 8.4 (L) 12.0 - 15.0 g/dL   HCT 27.6 (L) 36.0 - 46.0 %   MCV 89.0 80.0 - 100.0 fL   MCH 27.1 26.0 - 34.0 pg   MCHC 30.4 30.0 - 36.0 g/dL   RDW 22.3 (H) 11.5 - 15.5 %   Platelets 284 150 - 400 K/uL   nRBC 0.0 0.0 - 0.2 %   Neutrophils Relative % 75 %   Neutro Abs 6.0 1.7 - 7.7 K/uL   Lymphocytes Relative 17 %   Lymphs Abs 1.4 0.7 - 4.0 K/uL   Monocytes Relative 4 %   Monocytes Absolute 0.3 0.1 - 1.0 K/uL   Eosinophils Relative 3 %   Eosinophils Absolute 0.3 0.0 - 0.5 K/uL   Basophils Relative 1 %   Basophils Absolute 0.1 0.0 - 0.1 K/uL   WBC Morphology MORPHOLOGY UNREMARKABLE    Smear Review Normal platelet morphology    Immature Granulocytes 0 %   Abs Immature Granulocytes 0.02 0.00 - 0.07 K/uL   Schistocytes PRESENT    Burr Cells PRESENT    Polychromasia PRESENT  Comment: Performed at [facility], [address] [place], [place] [address]  Pathologist smear review     Status: None   Collection Time: 12/24/19  7:33 PM  Result Value Ref Range   Path Review Blood smear is reviewed.     Comment: Patient with acute respiratory failure. Receiving outpatient treatment for pneumonia. On dialysis. Perisistent normocytic anemia since at least April 2021. Burr cells, likely secondary to uremia.  Schistocytes noted.  Unremarkable WBC and platelet morphology. Schistocytes can be seen in the setting of mechanical distortion of RBC such as prosthetic heart valve or indwelling catheter. Schistocytes may also be seen in  the setting of microangiopathic hemolytic anemia (DIC, TTP, HUS). If there is concern for TTP recommend ADAMTS13 testing. These findings were communicated to Drs. Williams and Kolluru via [facility] secure chat on 12/25/2019. Reviewed by Dellia Nims Reuel Derby, M.D. Performed at [facility], [place], [place] [address]   Troponin I (High Sensitivity)     Status: Abnormal   Collection Time: 12/24/19  7:33 PM  Result Value Ref Range   Troponin I (High Sensitivity) 81 (H) <18 ng/L    Comment: (NOTE) Elevated high sensitivity troponin I (hsTnI) values and significant  changes across serial measurements may suggest ACS but many other  chronic and acute conditions are known to elevate hsTnI results.  Refer to the "Links" section for chest pain algorithms and additional  guidance. Performed at [facility], [address] [place], [place] [address]   Brain natriuretic peptide     Status: Abnormal   Collection Time: 12/24/19  7:33 PM  Result Value Ref Range   B Natriuretic Peptide 3,510.4 (H) 0.0 - 100.0 pg/mL    Comment: Performed at [facility], [address] [place],  [address]  Resp Panel by RT-PCR (Flu A&B, Covid) Nasopharyngeal Swab     Status: None   Collection Time: 12/24/19  9:27 PM   Specimen: Nasopharyngeal Swab; Nasopharyngeal(NP) swabs in vial transport medium  Result Value Ref Range   SARS Coronavirus 2 by RT PCR NEGATIVE NEGATIVE    Comment: (NOTE) SARS-CoV-2 target nucleic acids are NOT DETECTED.  The SARS-CoV-2 RNA is generally detectable in upper respiratory specimens during the acute phase of infection. The lowest concentration of SARS-CoV-2 viral copies this assay can detect is 138 copies/mL. A negative result does not preclude SARS-Cov-2 infection and should not be used as the sole basis for treatment or other patient management decisions. A negative result may occur with  improper specimen collection/handling, submission of  specimen other than nasopharyngeal swab, presence of viral mutation(s) within the areas targeted by this assay, and inadequate number of viral copies(<138 copies/mL). A negative result must be combined with clinical observations, patient history, and epidemiological information. The expected result is Negative.  Fact Sheet for Patients:  EntrepreneurPulse.com.au  Fact Sheet for Healthcare Providers:  IncredibleEmployment.be  This test is no t yet approved or cleared by the [place] FDA and  has been authorized for detection and/or diagnosis of SARS-CoV-2 by FDA under an Emergency Use Authorization (EUA). This EUA will remain  in effect (meaning this test can be used) for the duration of the COVID-19 declaration under Section 564(b)(1) of the Act, 21 U.S.C.section 360bbb-3(b)(1), unless the authorization is terminated  or revoked sooner.       Influenza A by PCR NEGATIVE NEGATIVE   Influenza B by PCR NEGATIVE NEGATIVE    Comment: (NOTE) The Xpert Xpress SARS-CoV-2/FLU/RSV plus assay  is intended as an aid in the diagnosis of influenza from Nasopharyngeal swab specimens and should not be used as a sole basis for treatment. Nasal washings and aspirates are unacceptable for Xpert Xpress SARS-CoV-2/FLU/RSV testing.  Fact Sheet for Patients: EntrepreneurPulse.com.au  Fact Sheet for Healthcare Providers: IncredibleEmployment.be  This test is not yet approved or cleared by the [place] FDA and has been authorized for detection and/or diagnosis of SARS-CoV-2 by FDA under an Emergency Use Authorization (EUA). This EUA will remain in effect (meaning this test can be used) for the duration of the COVID-19 declaration under Section 564(b)(1) of the Act, 21 U.S.C. section 360bbb-3(b)(1), unless the authorization is terminated or revoked.  Performed at [facility], [address]  [place], [place] [address]   MRSA PCR Screening     Status: None   Collection Time: 12/24/19  9:27 PM   Specimen: Nasal Mucosa; Nasopharyngeal  Result Value Ref Range   MRSA by PCR NEGATIVE NEGATIVE    Comment:        The GeneXpert MRSA Assay (FDA approved for NASAL specimens only), is one component of a comprehensive MRSA colonization surveillance program. It is not intended to diagnose MRSA infection nor to guide or monitor treatment for MRSA infections. Performed at [facility], [address] [place], [place] [address]   Lactic acid, plasma     Status: None   Collection Time: 12/24/19  9:42 PM  Result Value Ref Range   Lactic Acid, Venous 1.6 0.5 - 1.9 mmol/L    Comment: Performed at [facility], [address] [place], [place] [address]  Culture, blood (routine x 2)     Status: None   Collection Time: 12/24/19  9:42 PM   Specimen: BLOOD  Result Value Ref Range   Specimen Description BLOOD RIGHT ASSIST CONTROL    Special Requests      BOTTLES DRAWN AEROBIC AND ANAEROBIC Blood Culture adequate volume   Culture      NO GROWTH 5 DAYS Performed at [facility], [address], [place], [place] [address]    Report Status 12/29/2019 FINAL   Culture, blood (routine x 2)     Status: None   Collection Time: 12/24/19  9:50 PM   Specimen: BLOOD  Result Value Ref Range   Specimen Description BLOOD RIGHT HAND    Special Requests      BOTTLES DRAWN AEROBIC AND ANAEROBIC Blood Culture results may not be optimal due to an inadequate volume of blood received in culture bottles   Culture      NO GROWTH 5 DAYS Performed at [facility], [address], [place], [place] [address]    Report Status 12/29/2019 FINAL   Troponin I (High Sensitivity)     Status: Abnormal   Collection Time: 12/25/19 12:12 AM  Result Value Ref Range   Troponin I (High Sensitivity) 89 (H) <18 ng/L    Comment: (NOTE) Elevated high sensitivity troponin I (hsTnI) values  and significant  changes across serial measurements may suggest ACS but many other  chronic and acute conditions are known to elevate hsTnI results.  Refer to the "Links" section for chest pain algorithms and additional  guidance. Performed at [facility], [address] [place], [place] [address]   Protime-INR     Status: None   Collection Time: 12/25/19 12:12 AM  Result Value Ref Range   Prothrombin Time 13.9 11.4 - 15.2 seconds   INR 1.1 0.8 - 1.2  Comment: (NOTE) INR goal varies based on device and disease states. Performed at [facility], [address] [place], [place] [address]   Hepatitis B DNA, ultraquantitative, PCR     Status: None   Collection Time: 12/25/19  3:37 AM  Result Value Ref Range   HBV DNA SERPL PCR-ACNC HBV DNA not detected IU/mL   HBV DNA SERPL PCR-LOG IU UNABLE TO CALCULATE log10 IU/mL    Comment: (NOTE) Unable to calculate result since non-numeric result obtained for component test.    Test Info: Comment     Comment: (NOTE) The reportable range for this assay is 10 IU/mL to 1 billion IU/mL. Performed At: [facility] [place], [place] [id number] Rush Farmer MD [id number]   Hepatitis B surface antigen     Status: None   Collection Time: 12/25/19  3:37 AM  Result Value Ref Range   Hepatitis B Surface Ag NON REACTIVE NON REACTIVE    Comment: Performed at [facility], [address] [place], [place] [address]  Hepatitis B surface antibody     Status: None   Collection Time: 12/25/19  3:37 AM  Result Value Ref Range   Hepatitis B-Post >1,000.0 Immunity>9.9 mIU/mL    Comment: (NOTE)  Status of Immunity                     Anti-HBs Level  ------------------                     -------------- Inconsistent with Immunity                   0.0 - 9.9 Consistent with Immunity                          >9.9 Performed At: [facility] [place], [place] [id number] Rush Farmer MD JH:[id number]   Hemoglobin A1c     Status: None   Collection Time: 12/25/19  3:37 AM  Result Value Ref Range   Hgb A1c MFr Bld 5.1 4.8 - 5.6 %    Comment: (NOTE) Pre diabetes:          5.7%-6.4%  Diabetes:              >6.4%  Glycemic control for   <7.0% adults with diabetes    Mean Plasma Glucose 99.67 mg/dL    Comment: Performed at [facility] [address] [place],  [address]  CBG monitoring, ED     Status: None   Collection Time: 12/25/19  7:30 AM  Result Value Ref Range   Glucose-Capillary 89 70 - 99 mg/dL    Comment: Glucose reference range applies only to samples taken after fasting for at least 8 hours.    Radiology DG Chest 2 View  Result Date: 12/24/2019 CLINICAL DATA:  Dyspnea EXAM: CHEST - 2 VIEW COMPARISON:  07/07/2019 FINDINGS: Small to moderate right pleural effusion is present. There is mild compressive atelectasis of the right lung base. There is perihilar interstitial pulmonary infiltrate, asymmetrically more severe within the right lung, suspicious for mild interstitial pulmonary edema, possibly cardiogenic in nature. No confluent pulmonary infiltrate. No pneumothorax. Mild cardiomegaly is present, new from prior examination. Left internal jugular hemodialysis catheter is seen with its tips within the superior vena cava. IMPRESSION: Interval development of mild cardiomegaly, mild interstitial pulmonary edema, possibly cardiogenic in nature, and small to moderate right  pleural effusion. Interval exchange of hemodialysis catheter with split tip catheter tips noted within the superior vena cava. Electronically Signed   By: Fidela Salisbury MD   On: 12/24/2019 20:44    Assessment/Plan  ESRD (end stage renal disease) ([place]) Recommend:  At this time the patient does not have appropriate extremity access for dialysis  Patient should have a right radiocephalic AVF created created.  The risks, benefits and alternative therapies were reviewed in  detail with the patient.  All questions were answered.  The patient agrees to proceed with surgery.    Type 2 diabetes mellitus (HCC) blood glucose control important in reducing the progression of atherosclerotic disease. Also, involved in wound healing. On appropriate medications.   HTN (hypertension) blood pressure control important in reducing the progression of atherosclerotic disease. On appropriate oral medications.     Leotis Pain, MD  01/12/2020 12:09 PM    This note was created with Dragon medical transcription system.  Any errors from dictation are purely unintentional

## 2020-01-12 NOTE — Assessment & Plan Note (Signed)
blood pressure control important in reducing the progression of atherosclerotic disease. On appropriate oral medications.  

## 2020-01-12 NOTE — Assessment & Plan Note (Signed)
blood glucose control important in reducing the progression of atherosclerotic disease. Also, involved in wound healing. On appropriate medications.  

## 2020-01-12 NOTE — Progress Notes (Signed)
MRN : [id number]  Joyce West is a 30 y.o. (01/01/1991) female who presents with chief complaint of  Chief Complaint  Patient presents with  . New Patient (Initial Visit)    Vein mapping  .  History of Present Illness: Patient returns today in follow up for evaluation of dialysis access.  We saw her in the hospital several months ago and placed a PermCath.  We had not seen her in the office.  She had a vein mapping done at [facility] which I have independently reviewed which showed adequate cephalic vein throughout both upper extremities.  She is left-hand dominant.  She is currently getting dialysis through a left jugular PermCath on Mondays, Wednesdays, and Fridays.  She is doing well with this, but it is her third PermCath already in the dialysis center is very anxious for her to get her extremity access.  Current Outpatient Medications  Medication Sig Dispense Refill  . albuterol (VENTOLIN HFA) 108 (90 Base) MCG/ACT inhaler Inhale 2 puffs into the lungs every 4 (four) hours as needed.    Marland Kitchen amLODipine (NORVASC) 10 MG tablet Take 10 mg by mouth daily.    Marland Kitchen atorvastatin (LIPITOR) 80 MG tablet Take 80 mg by mouth daily. Patient does not know what does she takes    . calcium acetate (PHOSLO) 667 MG capsule Take 1,334 mg by mouth 3 (three) times daily.    . carvedilol (COREG) 25 MG tablet Take 25 mg by mouth 2 (two) times daily.    . furosemide (LASIX) 40 MG tablet Take 40 mg by mouth 3 (three) times daily.    Marland Kitchen gabapentin (NEURONTIN) 300 MG capsule Take 300 mg by mouth daily.    . hydrALAZINE (APRESOLINE) 100 MG tablet Take 100 mg by mouth 3 (three) times daily.    . hydrOXYzine (ATARAX/VISTARIL) 25 MG tablet Take 25 mg by mouth every 12 (twelve) hours as needed.    Marland Kitchen losartan (COZAAR) 100 MG tablet Take 100 mg by mouth daily.    . melatonin 3 MG TABS tablet Take by mouth.    . polyethylene glycol (MIRALAX / GLYCOLAX) 17 g packet Take by mouth.    Marland Kitchen aspirin 81 MG chewable tablet Chew by mouth  daily. (Patient not taking: No sig reported)    . gentamicin cream (GARAMYCIN) 0.1 % Apply 1 application topically 2 (two) times daily. (Patient not taking: Reported on 01/12/2020)    . guaiFENesin-codeine 100-10 MG/5ML syrup Take by mouth. (Patient not taking: Reported on 01/12/2020)    . liraglutide (VICTOZA) 18 MG/3ML SOPN Inject 1.8 mg into the skin daily.  (Patient not taking: Reported on 01/12/2020)    . traMADol (ULTRAM) 50 MG tablet Take 1 tablet (50 mg total) by mouth every 6 (six) hours as needed. (Patient not taking: Reported on 01/12/2020) 15 tablet 0   No current facility-administered medications for this visit.    Past Medical History:  Diagnosis Date  . Diabetes mellitus without complication ([place])   . Hypertension   . Ischemic stroke ([place])   . Renal disorder    ESRD, dialysis since may 2021  . Right sided weakness    secondary to stroke     Past Surgical History:  Procedure Laterality Date  . CESAREAN SECTION     x 3  . DIALYSIS/PERMA CATHETER INSERTION N/A 05/29/2019   Procedure: DIALYSIS/PERMA CATHETER INSERTION;  Surgeon: Algernon Huxley, MD;  Location: [facility];  Service: Cardiovascular;  Laterality: N/A;  . DIALYSIS/PERMA CATHETER  INSERTION N/A 11/02/2019   Procedure: DIALYSIS/PERMA CATHETER INSERTION;  Surgeon: Algernon Huxley, MD;  Location: [facility];  Service: Cardiovascular;  Laterality: N/A;  . TUBAL LIGATION       Social History   Tobacco Use  . Smoking status: Current Every Day Smoker    Types: Cigarettes  . Smokeless tobacco: Never Used  Substance Use Topics  . Alcohol use: Not Currently  . Drug use: Never       Family History No bleeding disorders, clotting disorders, aneurysms, or autoimmune diseases  Allergies  Allergen Reactions  . Enalapril     cough  . Lisinopril Cough     REVIEW OF SYSTEMS (Negative unless checked)  Constitutional: [] Weight loss  [] Fever  [] Chills Cardiac: [] Chest pain   [] Chest pressure    [] Palpitations   [] Shortness of breath when laying flat   [] Shortness of breath at rest   [] Shortness of breath with exertion. Vascular:  [] Pain in legs with walking   [] Pain in legs at rest   [] Pain in legs when laying flat   [] Claudication   [] Pain in feet when walking  [] Pain in feet at rest  [] Pain in feet when laying flat   [] History of DVT   [] Phlebitis   [] Swelling in legs   [] Varicose veins   [] Non-healing ulcers Pulmonary:   [] Uses home oxygen   [] Productive cough   [] Hemoptysis   [] Wheeze  [] COPD   [] Asthma Neurologic:  [] Dizziness  [] Blackouts   [] Seizures   [] History of stroke   [] History of TIA  [] Aphasia   [] Temporary blindness   [] Dysphagia   [] Weakness or numbness in arms   [] Weakness or numbness in legs Musculoskeletal:  [] Arthritis   [] Joint swelling   [] Joint pain   [] Low back pain Hematologic:  [] Easy bruising  [] Easy bleeding   [] Hypercoagulable state   [x] Anemic   Gastrointestinal:  [] Blood in stool   [] Vomiting blood  [] Gastroesophageal reflux/heartburn   [] Abdominal pain Genitourinary:  [x] Chronic kidney disease   [] Difficult urination  [] Frequent urination  [] Burning with urination   [] Hematuria Skin:  [] Rashes   [] Ulcers   [] Wounds Psychological:  [] History of anxiety   []  History of major depression.  Physical Examination  BP (!) 199/131 (BP Location: Left Arm)   Pulse 100   Resp 20   Ht 5\' 4"  (1.626 m)   Wt 152 lb (68.9 kg)   BMI 26.09 kg/m  Gen:  WD/WN, NAD Head: [place]/AT, No temporalis wasting. Ear/Nose/Throat: Hearing grossly intact, nares w/o erythema or drainage Eyes: Conjunctiva clear. Sclera non-icteric Neck: Supple.  Trachea midline Pulmonary:  Good air movement, no use of accessory muscles.  Cardiac: RRR, no JVD Vascular: Allen's test normal bilaterally. Left jugular permcath in place without erythema or drainage. Vessel Right Left  Radial Palpable Palpable                   Musculoskeletal: M/S 5/5 throughout.  No deformity or  atrophy. Neurologic: Sensation grossly intact in extremities.  Symmetrical.  Speech is fluent.  Psychiatric: Judgment intact, Mood & affect appropriate for pt's clinical situation. Dermatologic: No rashes or ulcers noted.  No cellulitis or open wounds.       Labs Recent Results (from the past 2160 hour(s))  [facility] vascular lab only)     Status: None   Collection Time: 11/02/19 12:00 PM  Result Value Ref Range   [facility] vascular lab) 4.2 3.5 - 5.1    Comment: Performed at [facility], [address] [place], [place] [address]  Glucose, capillary     Status: Abnormal   Collection Time: 11/02/19 12:10 PM  Result Value Ref Range   Glucose-Capillary 202 (H) 70 - 99 mg/dL    Comment: Glucose reference range applies only to samples taken after fasting for at least 8 hours.  Glucose, capillary     Status: Abnormal   Collection Time: 11/02/19  1:49 PM  Result Value Ref Range   Glucose-Capillary 175 (H) 70 - 99 mg/dL    Comment: Glucose reference range applies only to samples taken after fasting for at least 8 hours.  Lactic acid, plasma     Status: None   Collection Time: 12/24/19  7:33 PM  Result Value Ref Range   Lactic Acid, Venous 1.7 0.5 - 1.9 mmol/L    Comment: Performed at [facility], [address] [place], [place] [address]  Comprehensive metabolic panel     Status: Abnormal   Collection Time: 12/24/19  7:33 PM  Result Value Ref Range   Sodium 139 135 - 145 mmol/L   Potassium 4.6 3.5 - 5.1 mmol/L   Chloride 100 98 - 111 mmol/L   CO2 23 22 - 32 mmol/L   Glucose, Bld 137 (H) 70 - 99 mg/dL    Comment: Glucose reference range applies only to samples taken after fasting for at least 8 hours.   BUN 35 (H) 6 - 20 mg/dL   Creatinine, Ser 10.50 (H) 0.44 - 1.00 mg/dL   Calcium 9.7 8.9 - 10.3 mg/dL   Total Protein 8.3 (H) 6.5 - 8.1 g/dL   Albumin 3.7 3.5 - 5.0 g/dL   AST 24 15 - 41 U/L   ALT 17 0 - 44 U/L   Alkaline Phosphatase 110 38 -  126 U/L   Total Bilirubin 1.3 (H) 0.3 - 1.2 mg/dL   GFR, Estimated 5 (L) >60 mL/min    Comment: (NOTE) Calculated using the CKD-EPI Creatinine Equation (2021)    Anion gap 16 (H) 5 - 15    Comment: Performed at [facility], [address] [place], [place] [address]  CBC with Differential     Status: Abnormal   Collection Time: 12/24/19  7:33 PM  Result Value Ref Range   WBC 8.1 4.0 - 10.5 K/uL   RBC 3.10 (L) 3.87 - 5.11 MIL/uL   Hemoglobin 8.4 (L) 12.0 - 15.0 g/dL   HCT 27.6 (L) 36.0 - 46.0 %   MCV 89.0 80.0 - 100.0 fL   MCH 27.1 26.0 - 34.0 pg   MCHC 30.4 30.0 - 36.0 g/dL   RDW 22.3 (H) 11.5 - 15.5 %   Platelets 284 150 - 400 K/uL   nRBC 0.0 0.0 - 0.2 %   Neutrophils Relative % 75 %   Neutro Abs 6.0 1.7 - 7.7 K/uL   Lymphocytes Relative 17 %   Lymphs Abs 1.4 0.7 - 4.0 K/uL   Monocytes Relative 4 %   Monocytes Absolute 0.3 0.1 - 1.0 K/uL   Eosinophils Relative 3 %   Eosinophils Absolute 0.3 0.0 - 0.5 K/uL   Basophils Relative 1 %   Basophils Absolute 0.1 0.0 - 0.1 K/uL   WBC Morphology MORPHOLOGY UNREMARKABLE    Smear Review Normal platelet morphology    Immature Granulocytes 0 %   Abs Immature Granulocytes 0.02 0.00 - 0.07 K/uL   Schistocytes PRESENT    Burr Cells PRESENT    Polychromasia PRESENT  Comment: Performed at [facility], [address] [place], [place] [address]  Pathologist smear review     Status: None   Collection Time: 12/24/19  7:33 PM  Result Value Ref Range   Path Review Blood smear is reviewed.     Comment: Patient with acute respiratory failure. Receiving outpatient treatment for pneumonia. On dialysis. Perisistent normocytic anemia since at least April 2021. Burr cells, likely secondary to uremia.  Schistocytes noted.  Unremarkable WBC and platelet morphology. Schistocytes can be seen in the setting of mechanical distortion of RBC such as prosthetic heart valve or indwelling catheter. Schistocytes may also be seen in  the setting of microangiopathic hemolytic anemia (DIC, TTP, HUS). If there is concern for TTP recommend ADAMTS13 testing. These findings were communicated to Drs. Williams and Kolluru via [facility] secure chat on 12/25/2019. Reviewed by Dellia Nims Reuel Derby, M.D. Performed at [facility], [place], [place] [address]   Troponin I (High Sensitivity)     Status: Abnormal   Collection Time: 12/24/19  7:33 PM  Result Value Ref Range   Troponin I (High Sensitivity) 81 (H) <18 ng/L    Comment: (NOTE) Elevated high sensitivity troponin I (hsTnI) values and significant  changes across serial measurements may suggest ACS but many other  chronic and acute conditions are known to elevate hsTnI results.  Refer to the "Links" section for chest pain algorithms and additional  guidance. Performed at [facility], [address] [place], [place] [address]   Brain natriuretic peptide     Status: Abnormal   Collection Time: 12/24/19  7:33 PM  Result Value Ref Range   B Natriuretic Peptide 3,510.4 (H) 0.0 - 100.0 pg/mL    Comment: Performed at [facility], [address] [place], [place] [address]  Resp Panel by RT-PCR (Flu A&B, Covid) Nasopharyngeal Swab     Status: None   Collection Time: 12/24/19  9:27 PM   Specimen: Nasopharyngeal Swab; Nasopharyngeal(NP) swabs in vial transport medium  Result Value Ref Range   SARS Coronavirus 2 by RT PCR NEGATIVE NEGATIVE    Comment: (NOTE) SARS-CoV-2 target nucleic acids are NOT DETECTED.  The SARS-CoV-2 RNA is generally detectable in upper respiratory specimens during the acute phase of infection. The lowest concentration of SARS-CoV-2 viral copies this assay can detect is 138 copies/mL. A negative result does not preclude SARS-Cov-2 infection and should not be used as the sole basis for treatment or other patient management decisions. A negative result may occur with  improper specimen collection/handling, submission of  specimen other than nasopharyngeal swab, presence of viral mutation(s) within the areas targeted by this assay, and inadequate number of viral copies(<138 copies/mL). A negative result must be combined with clinical observations, patient history, and epidemiological information. The expected result is Negative.  Fact Sheet for Patients:  EntrepreneurPulse.com.au  Fact Sheet for Healthcare Providers:  IncredibleEmployment.be  This test is no t yet approved or cleared by the [place] FDA and  has been authorized for detection and/or diagnosis of SARS-CoV-2 by FDA under an Emergency Use Authorization (EUA). This EUA will remain  in effect (meaning this test can be used) for the duration of the COVID-19 declaration under Section 564(b)(1) of the Act, 21 U.S.C.section 360bbb-3(b)(1), unless the authorization is terminated  or revoked sooner.       Influenza A by PCR NEGATIVE NEGATIVE   Influenza B by PCR NEGATIVE NEGATIVE    Comment: (NOTE) The Xpert Xpress SARS-CoV-2/FLU/RSV plus assay  is intended as an aid in the diagnosis of influenza from Nasopharyngeal swab specimens and should not be used as a sole basis for treatment. Nasal washings and aspirates are unacceptable for Xpert Xpress SARS-CoV-2/FLU/RSV testing.  Fact Sheet for Patients: EntrepreneurPulse.com.au  Fact Sheet for Healthcare Providers: IncredibleEmployment.be  This test is not yet approved or cleared by the [place] FDA and has been authorized for detection and/or diagnosis of SARS-CoV-2 by FDA under an Emergency Use Authorization (EUA). This EUA will remain in effect (meaning this test can be used) for the duration of the COVID-19 declaration under Section 564(b)(1) of the Act, 21 U.S.C. section 360bbb-3(b)(1), unless the authorization is terminated or revoked.  Performed at [facility], [address]  [place], [place] [address]   MRSA PCR Screening     Status: None   Collection Time: 12/24/19  9:27 PM   Specimen: Nasal Mucosa; Nasopharyngeal  Result Value Ref Range   MRSA by PCR NEGATIVE NEGATIVE    Comment:        The GeneXpert MRSA Assay (FDA approved for NASAL specimens only), is one component of a comprehensive MRSA colonization surveillance program. It is not intended to diagnose MRSA infection nor to guide or monitor treatment for MRSA infections. Performed at [facility], [address] [place], [place] [address]   Lactic acid, plasma     Status: None   Collection Time: 12/24/19  9:42 PM  Result Value Ref Range   Lactic Acid, Venous 1.6 0.5 - 1.9 mmol/L    Comment: Performed at [facility], [address] [place], [place] [address]  Culture, blood (routine x 2)     Status: None   Collection Time: 12/24/19  9:42 PM   Specimen: BLOOD  Result Value Ref Range   Specimen Description BLOOD RIGHT ASSIST CONTROL    Special Requests      BOTTLES DRAWN AEROBIC AND ANAEROBIC Blood Culture adequate volume   Culture      NO GROWTH 5 DAYS Performed at [facility], [address] [place], [place] [address]    Report Status 12/29/2019 FINAL   Culture, blood (routine x 2)     Status: None   Collection Time: 12/24/19  9:50 PM   Specimen: BLOOD  Result Value Ref Range   Specimen Description BLOOD RIGHT HAND    Special Requests      BOTTLES DRAWN AEROBIC AND ANAEROBIC Blood Culture results may not be optimal due to an inadequate volume of blood received in culture bottles   Culture      NO GROWTH 5 DAYS Performed at [facility], [address], [place], [place] [address]    Report Status 12/29/2019 FINAL   Troponin I (High Sensitivity)     Status: Abnormal   Collection Time: 12/25/19 12:12 AM  Result Value Ref Range   Troponin I (High Sensitivity) 89 (H) <18 ng/L    Comment: (NOTE) Elevated high sensitivity troponin I (hsTnI) values  and significant  changes across serial measurements may suggest ACS but many other  chronic and acute conditions are known to elevate hsTnI results.  Refer to the "Links" section for chest pain algorithms and additional  guidance. Performed at [facility], [address] [place], [place] [address]   Protime-INR     Status: None   Collection Time: 12/25/19 12:12 AM  Result Value Ref Range   Prothrombin Time 13.9 11.4 - 15.2 seconds   INR 1.1 0.8 - 1.2  Comment: (NOTE) INR goal varies based on device and disease states. Performed at [facility], [address] [place], [place] [address]   Hepatitis B DNA, ultraquantitative, PCR     Status: None   Collection Time: 12/25/19  3:37 AM  Result Value Ref Range   HBV DNA SERPL PCR-ACNC HBV DNA not detected IU/mL   HBV DNA SERPL PCR-LOG IU UNABLE TO CALCULATE log10 IU/mL    Comment: (NOTE) Unable to calculate result since non-numeric result obtained for component test.    Test Info: Comment     Comment: (NOTE) The reportable range for this assay is 10 IU/mL to 1 billion IU/mL. Performed At: [facility] [place], [place] [id number] Rush Farmer MD [id number]   Hepatitis B surface antigen     Status: None   Collection Time: 12/25/19  3:37 AM  Result Value Ref Range   Hepatitis B Surface Ag NON REACTIVE NON REACTIVE    Comment: Performed at [facility], [address], [place], [place] [address]  Hepatitis B surface antibody     Status: None   Collection Time: 12/25/19  3:37 AM  Result Value Ref Range   Hepatitis B-Post >1,000.0 Immunity>9.9 mIU/mL    Comment: (NOTE)  Status of Immunity                     Anti-HBs Level  ------------------                     -------------- Inconsistent with Immunity                   0.0 - 9.9 Consistent with Immunity                          >9.9 Performed At: [facility] [place], [place] [id number] Rush Farmer MD [id number]   Hemoglobin A1c     Status: None   Collection Time: 12/25/19  3:37 AM  Result Value Ref Range   Hgb A1c MFr Bld 5.1 4.8 - 5.6 %    Comment: (NOTE) Pre diabetes:          5.7%-6.4%  Diabetes:              >6.4%  Glycemic control for   <7.0% adults with diabetes    Mean Plasma Glucose 99.67 mg/dL    Comment: Performed at [facility] [address], [place], [place] [address]  CBG monitoring, ED     Status: None   Collection Time: 12/25/19  7:30 AM  Result Value Ref Range   Glucose-Capillary 89 70 - 99 mg/dL    Comment: Glucose reference range applies only to samples taken after fasting for at least 8 hours.    Radiology DG Chest 2 View  Result Date: 12/24/2019 CLINICAL DATA:  Dyspnea EXAM: CHEST - 2 VIEW COMPARISON:  07/07/2019 FINDINGS: Small to moderate right pleural effusion is present. There is mild compressive atelectasis of the right lung base. There is perihilar interstitial pulmonary infiltrate, asymmetrically more severe within the right lung, suspicious for mild interstitial pulmonary edema, possibly cardiogenic in nature. No confluent pulmonary infiltrate. No pneumothorax. Mild cardiomegaly is present, new from prior examination. Left internal jugular hemodialysis catheter is seen with its tips within the superior vena cava. IMPRESSION: Interval development of mild cardiomegaly, mild interstitial pulmonary edema, possibly cardiogenic in nature, and small to moderate right  pleural effusion. Interval exchange of hemodialysis catheter with split tip catheter tips noted within the superior vena cava. Electronically Signed   By: Fidela Salisbury MD   On: 12/24/2019 20:44    Assessment/Plan  ESRD (end stage renal disease) ([place]) Recommend:  At this time the patient does not have appropriate extremity access for dialysis  Patient should have a right radiocephalic AVF created created.  The risks, benefits and alternative therapies were reviewed in  detail with the patient.  All questions were answered.  The patient agrees to proceed with surgery.    Type 2 diabetes mellitus (HCC) blood glucose control important in reducing the progression of atherosclerotic disease. Also, involved in wound healing. On appropriate medications.   HTN (hypertension) blood pressure control important in reducing the progression of atherosclerotic disease. On appropriate oral medications.     Leotis Pain, MD  01/12/2020 12:09 PM    This note was created with Dragon medical transcription system.  Any errors from dictation are purely unintentional

## 2020-01-12 NOTE — Assessment & Plan Note (Signed)
Recommend:  At this time the patient does not have appropriate extremity access for dialysis  Patient should have a right radiocephalic AVF created created.  The risks, benefits and alternative therapies were reviewed in detail with the patient.  All questions were answered.  The patient agrees to proceed with surgery.

## 2020-01-12 NOTE — Patient Instructions (Signed)
AV Fistula Placement  Arteriovenous (AV) fistula placement is a surgical procedure to create a connection between a blood vessel that carries blood away from your heart (artery) and a blood vessel that returns blood to your heart (vein). This connection is called a fistula. It is often made in the forearm or upper arm. You may need this procedure if you are getting hemodialysis treatments for kidney disease. An AV fistula makes your vein larger and stronger over several months. This makes the vein a safe and easy spot to insert the needles that are used for hemodialysis. Tell a health care provider about:  Any allergies you have.  All medicines you are taking, including vitamins, herbs, eye drops, creams, and over-the-counter medicines.  Any problems you or family members have had with anesthetic medicines.  Any blood disorders you have.  Any surgeries you have had.  Any medical conditions you have or have had in the past. What are the risks? Generally, this is a safe procedure. However, problems may occur, including:  Infection.  Blood clot (thrombosis).  Reduced blood flow (stenosis).  Weakening or ballooning out of the fistula (aneurysm).  Bleeding.  Allergic reactions to medicines.  Nerve damage.  Swelling near the fistula (lymphedema).  Weakening of your heart (congestive heart failure).  Failure of the procedure. What happens before the procedure? Staying hydrated Follow instructions from your health care provider about hydration, which may include:  Up to 2 hours before the procedure - you may continue to drink clear liquids, such as water, clear fruit juice, black coffee, and plain tea.  Eating and drinking restrictions Follow instructions from your health care provider about eating and drinking, which may include:  8 hours before the procedure - stop eating heavy meals or foods, such as meat, fried foods, or fatty foods.  6 hours before the procedure - stop  eating light meals or foods, such as toast or cereal.  6 hours before the procedure - stop drinking milk or drinks that contain milk.  2 hours before the procedure - stop drinking clear liquids. Medicines Ask your health care provider about:  Changing or stopping your regular medicines. This is especially important if you are taking diabetes medicines or blood thinners.  Taking medicines such as aspirin and ibuprofen. These medicines can thin your blood. Do not take these medicines unless your health care provider tells you to take them.  Taking over-the-counter medicines, vitamins, herbs, and supplements. General instructions  Imaging tests of your arm may be done to find the best place for the fistula.  Plan to have someone take you home from the hospital or clinic.  Ask your health care provider how your surgical site will be marked or identified.  Ask your health care provider what steps will be taken to help prevent infection. These may include: ? Removing hair at the surgery site. ? Washing skin with a germ-killing soap. ? Taking antibiotic medicine.  Do not use any products that contain nicotine or tobacco for as long as possible before and after the procedure. These products include cigarettes, e-cigarettes, and chewing tobacco. If you need help quitting, ask your health care provider. What happens during the procedure?  An IV will be inserted into one of your veins.  You will be given one or more of the following: ? A medicine to help you relax (sedative). ? A medicine to numb the area (local anesthetic). ? A medicine to make you fall asleep (general anesthetic). ? A medicine that  is injected into an area of your body to numb everything below the injection site (regional anesthetic).  The fistula site will be cleaned with a germ-killing solution (antiseptic).  An incision will be made on the inner side of your arm.  A vein and an artery will be opened and connected  with stitches (sutures).  The incision will be closed with sutures or clips.  A bandage (dressing) will be placed over the area. The procedure may vary among health care providers and hospitals. What happens after the procedure?  Your blood pressure, heart rate, breathing rate, and blood oxygen level may be monitored until you leave the hospital or clinic.  Your fistula site will be checked for bleeding or swelling.  You will be given pain medicine as needed.  Do not drive for 24 hours if you were given a sedative during your procedure. Summary  Arteriovenous (AV) fistula placement is a surgical procedure to create a connection between a blood vessel that carries blood away from your heart (artery) and a blood vessel that returns blood to your heart (vein). This connection is called a fistula.  Follow instructions from your health care provider about eating and drinking before the procedure.  Ask your health care provider about changing or stopping your regular medicines before the procedure. This is especially important if you are taking diabetes medicines or blood thinners.  Do not drive for 24 hours if you were given a sedative during your procedure.  Plan to have someone take you home from the hospital or clinic. This information is not intended to replace advice given to you by your health care provider. Make sure you discuss any questions you have with your health care provider. Document Revised: 06/13/2018 Document Reviewed: 07/01/2017 Elsevier Patient Education  [place].

## 2020-01-13 ENCOUNTER — Telehealth (INDEPENDENT_AMBULATORY_CARE_PROVIDER_SITE_OTHER): Payer: Self-pay

## 2020-01-13 NOTE — Telephone Encounter (Signed)
I spoke with the patient the day she was seen in office to schedule her for surgery with Dr. Lucky Cowboy on 01/21/20 for a right AVF. Patient is scheduled and will do a phone call pre-op on 01/19/20 between 8-1 pm and covid testing on 01/20/20 between 8-1 pm at the [facility]. Pre-surgical instructions will be mailed as I attempted to contact to discuss instructions and the voice mail box is full.

## 2020-01-17 DIAGNOSIS — Z8616 Personal history of COVID-19: Secondary | ICD-10-CM

## 2020-01-17 HISTORY — DX: Personal history of COVID-19: Z86.16

## 2020-01-19 ENCOUNTER — Inpatient Hospital Stay
Admission: RE | Admit: 2020-01-19 | Discharge: 2020-01-19 | Disposition: A | Discharge status: Home / Self Care | Payer: Medicare Other | Source: Ambulatory Visit

## 2020-01-19 NOTE — Patient Instructions (Signed)
Your procedure is scheduled on: Report to the Registration Desk on the 1st floor of the [facility]. To find out your arrival time, please call [phone] between 1PM - 3PM on:  REMEMBER: Instructions that are not followed completely may result in serious medical risk, up to and including death; or upon the discretion of your surgeon and anesthesiologist your surgery may need to be rescheduled.  Do not eat food after midnight the night before surgery.  No gum chewing, lozengers or hard candies.  You may however, drink CLEAR liquids up to 2 hours before you are scheduled to arrive for your surgery. Do not drink anything within 2 hours of your scheduled arrival time.  Type 1 and Type 2 diabetics should only drink water.  TAKE THESE MEDICATIONS THE MORNING OF SURGERY WITH A SIP OF WATER: - amLODipine (NORVASC) 10 MG tablet - gabapentin (NEURONTIN) 300 MG capsule -hydrALAZINE (APRESOLINE) 100 MG tablet  - carvedilol (COREG) 25 MG tablet  Use inhaler albuterol (VENTOLIN HFA) 108 (90 Base) MCG/ACT inhaler on the day of surgery and bring to the hospital.  Follow recommendations from Cardiologist, Pulmonologist or PCP regarding stopping Aspirin, Coumadin, Plavix, Eliquis, Pradaxa, or Pletal.  One week prior to surgery: Stop Anti-inflammatories (NSAIDS) such as Advil, Aleve, Ibuprofen, Motrin, Naproxen, Naprosyn and Aspirin based products such as Excedrin, Goodys Powder, BC Powder. Stop ANY OVER THE COUNTER supplements until after surgery. (However, you may continue taking Vitamin D, Vitamin B, and multivitamin up until the day before surgery.)  No Alcohol for 24 hours before or after surgery.  No Smoking including e-cigarettes for 24 hours prior to surgery.  No chewable tobacco products for at least 6 hours prior to surgery.  No nicotine patches on the day of surgery.  Do not use any "recreational" drugs for at least a week prior to your surgery.  Please be advised that the  combination of cocaine and anesthesia may have negative outcomes, up to and including death. If you test positive for cocaine, your surgery will be cancelled.  On the morning of surgery brush your teeth with toothpaste and water, you may rinse your mouth with mouthwash if you wish. Do not swallow any toothpaste or mouthwash.  Do not wear jewelry, make-up, hairpins, clips or nail polish.  Do not wear lotions, powders, or perfumes.   Do not shave body from the neck down 48 hours prior to surgery just in case you cut yourself which could leave a site for infection.  Also, freshly shaved skin may become irritated if using the CHG soap.  Contact lenses, hearing aids and dentures may not be worn into surgery.  Do not bring valuables to the hospital. [facility] is not responsible for any missing/lost belongings or valuables.   Use CHG Soap or wipes as directed on instruction sheet.  Notify your doctor if there is any change in your medical condition (cold, fever, infection).  Wear comfortable clothing (specific to your surgery type) to the hospital.  Plan for stool softeners for home use; pain medications have a tendency to cause constipation. You can also help prevent constipation by eating foods high in fiber such as fruits and vegetables and drinking plenty of fluids as your diet allows.  After surgery, you can help prevent lung complications by doing breathing exercises.  Take deep breaths and cough every 1-2 hours. Your doctor may order a device called an Incentive Spirometer to help you take deep breaths. When coughing or sneezing, hold a pillow firmly  against your incision with both hands. This is called "splinting." Doing this helps protect your incision. It also decreases belly discomfort.  If you are being admitted to the hospital overnight, leave your suitcase in the car. After surgery it may be brought to your room.  If you are being discharged the day of surgery, you will not  be allowed to drive home. You will need a responsible adult (18 years or older) to drive you home and stay with you that night.   If you are taking public transportation, you will need to have a responsible adult (18 years or older) with you. Please confirm with your physician that it is acceptable to use public transportation.   Please call the [place] Dept. at [phone] if you have any questions about these instructions.  Visitation Policy:  Patients undergoing a surgery or procedure may have one family member or support person with them as long as that person is not COVID-19 positive or experiencing its symptoms.  That person may remain in the waiting area during the procedure.  Inpatient Visitation:    Visiting hours are 7 a.m. to 8 p.m. Patients will be allowed one visitor. The visitor may change daily. The visitor must pass COVID-19 screenings, use hand sanitizer when entering and exiting the patient's room and wear a mask at all times, including in the patient's room. Patients must also wear a mask when staff or their visitor are in the room. Masking is required regardless of vaccination status. Systemwide, no visitors 17 or younger.

## 2020-01-19 NOTE — Progress Notes (Signed)
  [facility] Perioperative Services: Pre-Admission/Anesthesia Testing     Date: 01/19/20  Name: Yanis Juma MRN:   [id number]  Re: Plans for upcoming procedure   Case: [id number] Date/Time: 01/21/20 1350   Procedure: ARTERIOVENOUS (AV) FISTULA CREATION (Right )   Anesthesia type: General   Pre-op diagnosis: ESRD   Location: [facility] OR ROOM 07 / [place]   Surgeons: Algernon Huxley, MD     Patient is scheduled for the above procedure on 01/21/2020 with Dr. Leotis Pain.  PAT RN called patient to do preoperative interview on 01/19/2020 and learned that patient currently symptomatic with lab confirmed SARS-CoV-2 that was diagnosed at [facility] on 01/17/2020.  Given the nature of the patient's procedure, I reached out to the primary attending surgeon's office to make them aware. Needing to determine the urgency of this procedure and the feasibility of postponing procedure in light of her current infection. Spoke with surgery scheduler and learned that patient has been cancelled until further notice at this point.   Honor Loh, MSN, APRN, FNP-C, CEN [facility]  Peri-operative Services Nurse Practitioner Phone: [phone] 01/19/20 8:34 AM

## 2020-01-20 ENCOUNTER — Other Ambulatory Visit: Payer: Medicare Other

## 2020-01-20 ENCOUNTER — Telehealth (INDEPENDENT_AMBULATORY_CARE_PROVIDER_SITE_OTHER): Payer: Self-pay

## 2020-01-20 NOTE — Telephone Encounter (Signed)
01/19/20  Good morning [name]..... This patient is COVID (+)... Dx'd in the ED on the 9th. Scheduled for AV fistula creation on the 13th.  Me: Oh she has been canceled until further notice.  Honor Loh NP: Oh ok... very good. thank you.   Me:  I'm happy to help you

## 2020-01-22 NOTE — Telephone Encounter (Signed)
I attempted to contact the patient to let her know she has been rescheduled for her right AVF surgery to 02/04/20 with Dr. Lucky Cowboy. A message was left for a return call. Patient scheduled on 02/04/20 for surgery at the [facility]. Pre-op phone call is on 02/01/20 between 8-1 pm. Patient is covid positive and will not need another test at this time. Pre-surgical instructions will be mailed.

## 2020-02-01 ENCOUNTER — Other Ambulatory Visit
Admission: RE | Admit: 2020-02-01 | Discharge: 2020-02-01 | Disposition: A | Discharge status: Home / Self Care | Payer: Medicare Other | Source: Ambulatory Visit | Attending: Vascular Surgery | Admitting: Vascular Surgery

## 2020-02-01 ENCOUNTER — Other Ambulatory Visit: Payer: Self-pay

## 2020-02-01 ENCOUNTER — Other Ambulatory Visit (INDEPENDENT_AMBULATORY_CARE_PROVIDER_SITE_OTHER): Payer: Self-pay | Admitting: Nurse Practitioner

## 2020-02-01 HISTORY — DX: Pneumonia, unspecified organism: J18.9

## 2020-02-01 HISTORY — DX: Anxiety disorder, unspecified: F41.9

## 2020-02-01 NOTE — Patient Instructions (Signed)
Your procedure is scheduled on: Report to the Registration Desk on the 1st floor of the [facility]. To find out your arrival time, please call [phone] between 1PM - 3PM on: Wednesday   REMEMBER: Instructions that are not followed completely may result in serious medical risk, up to and including death; or upon the discretion of your surgeon and anesthesiologist your surgery may need to be rescheduled.  Do not eat food after midnight the night before surgery.  No gum chewing, lozengers or hard candies.  You may however, drink CLEAR liquids up to 2 hours before you are scheduled to arrive for your surgery. Do not drink anything within 2 hours of your scheduled arrival time.  Clear liquids include: - water   Do NOT drink anything that is not on this list.  Type 1 and Type 2 diabetics should only drink water.   TAKE THESE MEDICATIONS THE MORNING OF SURGERY WITH A SIP OF WATER: GABAPENTIN CARVEDILOL HYLDRALAZINE CLONIDINE  Use inhalers on the day of surgery   One week prior to surgery: Stop Anti-inflammatories (NSAIDS) such as Advil, Aleve, Ibuprofen, Motrin, Naproxen, Naprosyn and ASPIRIN OR Aspirin based products such as Excedrin, Goodys Powder, BC Powder. Stop ANY OVER THE COUNTER supplements until after surgery. STOP MELATONIN (However, you may continue taking Vitamin D, Vitamin B, and multivitamin up until the day before surgery.)  No Alcohol for 24 hours before or after surgery.  No Smoking including e-cigarettes for 24 hours prior to surgery.  No chewable tobacco products for at least 6 hours prior to surgery.  No nicotine patches on the day of surgery.  Do not use any "recreational" drugs for at least a week prior to your surgery.  Please be advised that the combination of cocaine and anesthesia may have negative outcomes, up to and including death. If you test positive for cocaine, your surgery will be cancelled.  On the morning of surgery brush your teeth with  toothpaste and water, you may rinse your mouth with mouthwash if you wish. Do not swallow any toothpaste or mouthwash.  Do not wear jewelry, make-up, hairpins, clips or nail polish.  Do not wear lotions, powders, or perfumes OR DEODORANT   Do not shave body from the neck down 48 hours prior to surgery just in case you cut yourself which could leave a site for infection.  Also, freshly shaved skin may become irritated if using the CHG soap.  Contact lenses, hearing aids and dentures may not be worn into surgery.  Do not bring valuables to the hospital. [facility] is not responsible for any missing/lost belongings or valuables.   Use CHG  wipes as directed on instruction sheet.  Notify your doctor if there is any change in your medical condition (cold, fever, infection).  Wear comfortable clothing (specific to your surgery type) to the hospital.  Plan for stool softeners for home use; pain medications have a tendency to cause constipation. You can also help prevent constipation by eating foods high in fiber such as fruits and vegetables and drinking plenty of fluids as your diet allows.  After surgery, you can help prevent lung complications by doing breathing exercises.  Take deep breaths and cough every 1-2 hours. Your doctor may order a device called an Incentive Spirometer to help you take deep breaths. When coughing or sneezing, hold a pillow firmly against your incision with both hands. This is called "splinting." Doing this helps protect your incision. It also decreases belly discomfort.  If  you are being discharged the day of surgery, you will not be allowed to drive home. You will need a responsible adult (18 years or older) to drive you home and stay with you that night.   If you are taking public transportation, you will need to have a responsible adult (18 years or older) with you. Please confirm with your physician that it is acceptable to use public transportation.    Please call the [place] Dept. at [phone] if you have any questions about these instructions.  Visitation Policy:  Patients undergoing a surgery or procedure may have one family member or support person with them as long as that person is not COVID-19 positive or experiencing its symptoms.  That person may remain in the waiting area during the procedure.  Inpatient Visitation:    Visiting hours are 7 a.m. to 8 p.m. Patients will be allowed one visitor. The visitor may change daily. The visitor must pass COVID-19 screenings, use hand sanitizer when entering and exiting the patient's room and wear a mask at all times, including in the patient's room. Patients must also wear a mask when staff or their visitor are in the room. Masking is required regardless of vaccination status. Systemwide, no visitors 17 or younger.

## 2020-02-02 ENCOUNTER — Other Ambulatory Visit
Admission: RE | Admit: 2020-02-02 | Discharge: 2020-02-02 | Disposition: A | Discharge status: Home / Self Care | Payer: Medicare Other | Source: Ambulatory Visit | Attending: Vascular Surgery | Admitting: Vascular Surgery

## 2020-02-02 NOTE — Pre-Procedure Instructions (Signed)
Attempted to contact patient via phone to remind her of her covid test for upcoming surgery.  Unable to reach her directly but did leave a message on her voicemail.

## 2020-02-03 ENCOUNTER — Encounter: Payer: Self-pay | Admitting: Vascular Surgery

## 2020-02-03 ENCOUNTER — Other Ambulatory Visit
Admission: RE | Admit: 2020-02-03 | Discharge: 2020-02-03 | Disposition: A | Discharge status: Home / Self Care | Payer: Medicare Other | Source: Ambulatory Visit | Attending: Vascular Surgery | Admitting: Vascular Surgery

## 2020-02-03 ENCOUNTER — Other Ambulatory Visit: Payer: Self-pay

## 2020-02-03 DIAGNOSIS — Z01812 Encounter for preprocedural laboratory examination: Secondary | ICD-10-CM | POA: Diagnosis present

## 2020-02-03 LAB — CBC WITH DIFFERENTIAL/PLATELET
Abs Immature Granulocytes: 0.02 10*3/uL (ref 0.00–0.07)
Basophils Absolute: 0.1 10*3/uL (ref 0.0–0.1)
Basophils Relative: 2 %
Eosinophils Absolute: 0.2 10*3/uL (ref 0.0–0.5)
Eosinophils Relative: 4 %
HCT: 26.6 % — ABNORMAL LOW (ref 36.0–46.0)
Hemoglobin: 8.6 g/dL — ABNORMAL LOW (ref 12.0–15.0)
Immature Granulocytes: 0 %
Lymphocytes Relative: 26 %
Lymphs Abs: 1.7 10*3/uL (ref 0.7–4.0)
MCH: 28.6 pg (ref 26.0–34.0)
MCHC: 32.3 g/dL (ref 30.0–36.0)
MCV: 88.4 fL (ref 80.0–100.0)
Monocytes Absolute: 0.3 10*3/uL (ref 0.1–1.0)
Monocytes Relative: 5 %
Neutro Abs: 4.3 10*3/uL (ref 1.7–7.7)
Neutrophils Relative %: 63 %
Platelets: 360 10*3/uL (ref 150–400)
RBC: 3.01 MIL/uL — ABNORMAL LOW (ref 3.87–5.11)
RDW: 16 % — ABNORMAL HIGH (ref 11.5–15.5)
WBC: 6.8 10*3/uL (ref 4.0–10.5)
nRBC: 0 % (ref 0.0–0.2)

## 2020-02-03 LAB — PROTIME-INR
INR: 1.1 (ref 0.8–1.2)
Prothrombin Time: 14.1 seconds (ref 11.4–15.2)

## 2020-02-03 LAB — BASIC METABOLIC PANEL
Anion gap: 16 — ABNORMAL HIGH (ref 5–15)
BUN: 41 mg/dL — ABNORMAL HIGH (ref 6–20)
CO2: 24 mmol/L (ref 22–32)
Calcium: 9.9 mg/dL (ref 8.9–10.3)
Chloride: 102 mmol/L (ref 98–111)
Creatinine, Ser: 9.74 mg/dL — ABNORMAL HIGH (ref 0.44–1.00)
GFR, Estimated: 5 mL/min — ABNORMAL LOW (ref >60)
Glucose, Bld: 116 mg/dL — ABNORMAL HIGH (ref 70–99)
Potassium: 4.8 mmol/L (ref 3.5–5.1)
Sodium: 142 mmol/L (ref 135–145)

## 2020-02-03 LAB — TYPE AND SCREEN
ABO/RH(D): O POS
Antibody Screen: NEGATIVE

## 2020-02-03 LAB — APTT: aPTT: 31 seconds (ref 24–36)

## 2020-02-04 ENCOUNTER — Encounter: Payer: Self-pay | Admitting: Vascular Surgery

## 2020-02-04 ENCOUNTER — Ambulatory Visit: Payer: Medicare Other | Admitting: Urgent Care

## 2020-02-04 ENCOUNTER — Ambulatory Visit
Admission: RE | Admit: 2020-02-04 | Discharge: 2020-02-04 | Disposition: A | Discharge status: Home / Self Care | Payer: Medicare Other | Attending: Vascular Surgery | Admitting: Vascular Surgery

## 2020-02-04 ENCOUNTER — Other Ambulatory Visit: Payer: Self-pay

## 2020-02-04 ENCOUNTER — Ambulatory Visit: Payer: Medicare Other

## 2020-02-04 ENCOUNTER — Encounter
Admission: RE | Disposition: A | Discharge status: Home / Self Care | Payer: Self-pay | Source: Home / Self Care | Attending: Vascular Surgery

## 2020-02-04 DIAGNOSIS — Z79899 Other long term (current) drug therapy: Secondary | ICD-10-CM | POA: Insufficient documentation

## 2020-02-04 DIAGNOSIS — N185 Chronic kidney disease, stage 5: Secondary | ICD-10-CM

## 2020-02-04 DIAGNOSIS — F17210 Nicotine dependence, cigarettes, uncomplicated: Secondary | ICD-10-CM | POA: Insufficient documentation

## 2020-02-04 DIAGNOSIS — Z992 Dependence on renal dialysis: Secondary | ICD-10-CM | POA: Insufficient documentation

## 2020-02-04 DIAGNOSIS — I120 Hypertensive chronic kidney disease with stage 5 chronic kidney disease or end stage renal disease: Secondary | ICD-10-CM | POA: Insufficient documentation

## 2020-02-04 DIAGNOSIS — E1122 Type 2 diabetes mellitus with diabetic chronic kidney disease: Secondary | ICD-10-CM | POA: Insufficient documentation

## 2020-02-04 DIAGNOSIS — N186 End stage renal disease: Secondary | ICD-10-CM | POA: Insufficient documentation

## 2020-02-04 DIAGNOSIS — Z419 Encounter for procedure for purposes other than remedying health state, unspecified: Secondary | ICD-10-CM

## 2020-02-04 HISTORY — PX: AV FISTULA PLACEMENT: SHX1204

## 2020-02-04 LAB — POCT I-STAT, CHEM 8
BUN: 45 mg/dL — ABNORMAL HIGH (ref 6–20)
Calcium, Ion: 1.2 mmol/L (ref 1.15–1.40)
Chloride: 103 mmol/L (ref 98–111)
Creatinine, Ser: 11.9 mg/dL — ABNORMAL HIGH (ref 0.44–1.00)
Glucose, Bld: 138 mg/dL — ABNORMAL HIGH (ref 70–99)
HCT: 26 % — ABNORMAL LOW (ref 36.0–46.0)
Hemoglobin: 8.8 g/dL — ABNORMAL LOW (ref 12.0–15.0)
Potassium: 5.2 mmol/L — ABNORMAL HIGH (ref 3.5–5.1)
Sodium: 140 mmol/L (ref 135–145)
TCO2: 24 mmol/L (ref 22–32)

## 2020-02-04 LAB — GLUCOSE, CAPILLARY: Glucose-Capillary: 118 mg/dL — ABNORMAL HIGH (ref 70–99)

## 2020-02-04 LAB — HCG, QUANTITATIVE, PREGNANCY: hCG, Beta Chain, Quant, S: 2 m[IU]/mL (ref <5)

## 2020-02-04 SURGERY — ARTERIOVENOUS (AV) FISTULA CREATION
Anesthesia: General | Laterality: Right

## 2020-02-04 MED ORDER — CHLORHEXIDINE GLUCONATE 0.12 % MT SOLN: 15.0000 mL | Freq: Once | OROMUCOSAL | Status: AC

## 2020-02-04 MED ORDER — PHENYLEPHRINE HCL (PRESSORS) 10 MG/ML IV SOLN
INTRAVENOUS | Status: AC
  Filled 2020-02-04: qty 1

## 2020-02-04 MED ORDER — HEPARIN SODIUM (PORCINE) 1000 UNIT/ML IJ SOLN
INTRAMUSCULAR | Status: DC | PRN
  Administered 2020-02-04: 3000 [IU] via INTRAVENOUS

## 2020-02-04 MED ORDER — KETAMINE HCL 50 MG/5ML IJ SOSY
PREFILLED_SYRINGE | INTRAMUSCULAR | Status: AC
  Filled 2020-02-04: qty 5

## 2020-02-04 MED ORDER — MIDAZOLAM HCL 2 MG/2ML IJ SOLN: 1.0000 mg | INTRAMUSCULAR | Status: DC | PRN

## 2020-02-04 MED ORDER — FENTANYL CITRATE (PF) 100 MCG/2ML IJ SOLN: 25.0000 ug | INTRAMUSCULAR | Status: DC | PRN

## 2020-02-04 MED ORDER — HEPARIN SODIUM (PORCINE) 5000 UNIT/ML IJ SOLN
INTRAMUSCULAR | Status: AC
  Filled 2020-02-04: qty 1

## 2020-02-04 MED ORDER — HYDROCODONE-ACETAMINOPHEN 5-325 MG PO TABS: 1.0000 | ORAL_TABLET | Freq: Four times a day (QID) | ORAL | 0 refills | Status: AC | PRN

## 2020-02-04 MED ORDER — CEFAZOLIN SODIUM-DEXTROSE 1-4 GM/50ML-% IV SOLN
1.0000 g | INTRAVENOUS | Status: AC
  Administered 2020-02-04: 1 g via INTRAVENOUS

## 2020-02-04 MED ORDER — FENTANYL CITRATE (PF) 100 MCG/2ML IJ SOLN: 50.0000 ug | INTRAMUSCULAR | Status: DC | PRN

## 2020-02-04 MED ORDER — CHLORHEXIDINE GLUCONATE 0.12 % MT SOLN
OROMUCOSAL | Status: AC
  Administered 2020-02-04: 15 mL via OROMUCOSAL
  Filled 2020-02-04: qty 15

## 2020-02-04 MED ORDER — ROPIVACAINE HCL 5 MG/ML IJ SOLN
INTRAMUSCULAR | Status: DC | PRN
Start: 2020-02-04 — End: 2020-02-04
  Administered 2020-02-04: 30 mL via EPIDURAL

## 2020-02-04 MED ORDER — LIDOCAINE HCL (PF) 2 % IJ SOLN
INTRAMUSCULAR | Status: AC
  Filled 2020-02-04: qty 10

## 2020-02-04 MED ORDER — KETAMINE HCL 10 MG/ML IJ SOLN
INTRAMUSCULAR | Status: DC | PRN
  Administered 2020-02-04: 20 mg via INTRAVENOUS

## 2020-02-04 MED ORDER — LIDOCAINE HCL (PF) 1 % IJ SOLN
INTRAMUSCULAR | Status: DC | PRN
  Administered 2020-02-04: 5 mL via SUBCUTANEOUS

## 2020-02-04 MED ORDER — PROPOFOL 10 MG/ML IV BOLUS
INTRAVENOUS | Status: DC | PRN
  Administered 2020-02-04: 50 mg via INTRAVENOUS
  Administered 2020-02-04: 30 mg via INTRAVENOUS

## 2020-02-04 MED ORDER — LIDOCAINE HCL (PF) 1 % IJ SOLN
INTRAMUSCULAR | Status: AC
  Filled 2020-02-04: qty 5

## 2020-02-04 MED ORDER — MIDAZOLAM HCL 2 MG/2ML IJ SOLN
INTRAMUSCULAR | Status: AC
  Administered 2020-02-04: 1 mg via INTRAVENOUS
  Filled 2020-02-04: qty 2

## 2020-02-04 MED ORDER — HEPARIN SODIUM (PORCINE) 1000 UNIT/ML IJ SOLN
INTRAMUSCULAR | Status: AC
  Filled 2020-02-04: qty 1

## 2020-02-04 MED ORDER — MIDAZOLAM HCL 2 MG/2ML IJ SOLN
INTRAMUSCULAR | Status: AC
  Filled 2020-02-04: qty 2

## 2020-02-04 MED ORDER — SODIUM CHLORIDE 0.9 % IV SOLN
INTRAVENOUS | Status: DC | PRN
  Administered 2020-02-04: 14:00:00 100 mL via INTRAMUSCULAR

## 2020-02-04 MED ORDER — FENTANYL CITRATE (PF) 100 MCG/2ML IJ SOLN
INTRAMUSCULAR | Status: AC
  Administered 2020-02-04: 50 ug via INTRAVENOUS
  Filled 2020-02-04: qty 2

## 2020-02-04 MED ORDER — FAMOTIDINE 20 MG PO TABS
ORAL_TABLET | ORAL | Status: AC
  Administered 2020-02-04: 20 mg via ORAL
  Filled 2020-02-04: qty 1

## 2020-02-04 MED ORDER — CHLORHEXIDINE GLUCONATE CLOTH 2 % EX PADS: 6.0000 | MEDICATED_PAD | Freq: Once | CUTANEOUS | Status: DC

## 2020-02-04 MED ORDER — ORAL CARE MOUTH RINSE: 15.0000 mL | Freq: Once | OROMUCOSAL | Status: AC

## 2020-02-04 MED ORDER — PROPOFOL 500 MG/50ML IV EMUL
INTRAVENOUS | Status: AC
  Filled 2020-02-04: qty 50

## 2020-02-04 MED ORDER — BUPIVACAINE HCL (PF) 0.5 % IJ SOLN
INTRAMUSCULAR | Status: AC
  Filled 2020-02-04: qty 30

## 2020-02-04 MED ORDER — PROPOFOL 500 MG/50ML IV EMUL
INTRAVENOUS | Status: DC | PRN
  Administered 2020-02-04: 100 ug/kg/min via INTRAVENOUS

## 2020-02-04 MED ORDER — PROMETHAZINE HCL 25 MG/ML IJ SOLN
6.2500 mg | INTRAMUSCULAR | Status: DC | PRN
Start: 2020-02-04 — End: 2020-02-04

## 2020-02-04 MED ORDER — CEFAZOLIN SODIUM-DEXTROSE 1-4 GM/50ML-% IV SOLN
INTRAVENOUS | Status: AC
  Filled 2020-02-04: qty 50

## 2020-02-04 MED ORDER — FENTANYL CITRATE (PF) 100 MCG/2ML IJ SOLN
INTRAMUSCULAR | Status: AC
  Filled 2020-02-04: qty 2

## 2020-02-04 MED ORDER — DEXAMETHASONE SODIUM PHOSPHATE 10 MG/ML IJ SOLN
INTRAMUSCULAR | Status: AC
  Filled 2020-02-04: qty 1

## 2020-02-04 MED ORDER — FAMOTIDINE 20 MG PO TABS: 20.0000 mg | ORAL_TABLET | Freq: Once | ORAL | Status: AC

## 2020-02-04 MED ORDER — SODIUM CHLORIDE 0.9 % IV SOLN: INTRAVENOUS | Status: DC

## 2020-02-04 MED ORDER — EPINEPHRINE PF 1 MG/ML IJ SOLN
INTRAMUSCULAR | Status: AC
  Filled 2020-02-04: qty 1

## 2020-02-04 MED ORDER — ROPIVACAINE HCL 5 MG/ML IJ SOLN
INTRAMUSCULAR | Status: AC
  Filled 2020-02-04: qty 30

## 2020-02-04 SURGICAL SUPPLY — 55 items
ADH SKN CLS APL DERMABOND .7 (GAUZE/BANDAGES/DRESSINGS) ×1
APL PRP STRL LF DISP 70% ISPRP (MISCELLANEOUS) ×1
BAG DECANTER FOR FLEXI CONT (MISCELLANEOUS) ×2 IMPLANT
BLADE SURG SZ11 CARB STEEL (BLADE) ×2 IMPLANT
BOOT SUTURE AID YELLOW STND (SUTURE) ×2 IMPLANT
BRUSH SCRUB EZ  4% CHG (MISCELLANEOUS) ×1
BRUSH SCRUB EZ 4% CHG (MISCELLANEOUS) ×1 IMPLANT
CHLORAPREP W/TINT 26 (MISCELLANEOUS) ×2 IMPLANT
CLIP SPRNG 6 S-JAW DBL (CLIP) ×1 IMPLANT
CLIP SPRNG 6MM S-JAW DBL (CLIP) ×2
COVER WAND RF STERILE (DRAPES) ×2 IMPLANT
DERMABOND ADVANCED (GAUZE/BANDAGES/DRESSINGS) ×1
DERMABOND ADVANCED .7 DNX12 (GAUZE/BANDAGES/DRESSINGS) ×1 IMPLANT
ELECT CAUTERY BLADE 6.4 (BLADE) ×2 IMPLANT
ELECT REM PT RETURN 9FT ADLT (ELECTROSURGICAL) ×2
ELECTRODE REM PT RTRN 9FT ADLT (ELECTROSURGICAL) ×1 IMPLANT
GEL ULTRASOUND 20GR AQUASONIC (MISCELLANEOUS) IMPLANT
GLOVE INDICATOR 7.5 STRL GRN (GLOVE) ×2 IMPLANT
GLOVE SURG ENC MOIS LTX SZ7 (GLOVE) ×4 IMPLANT
GOWN STRL REUS W/ TWL LRG LVL3 (GOWN DISPOSABLE) ×2 IMPLANT
GOWN STRL REUS W/ TWL XL LVL3 (GOWN DISPOSABLE) ×1 IMPLANT
GOWN STRL REUS W/TWL LRG LVL3 (GOWN DISPOSABLE) ×4
GOWN STRL REUS W/TWL XL LVL3 (GOWN DISPOSABLE) ×2
HEMOSTAT SURGICEL 2X3 (HEMOSTASIS) ×1 IMPLANT
IV NS 500ML (IV SOLUTION) ×2
IV NS 500ML BAXH (IV SOLUTION) ×1 IMPLANT
KIT TURNOVER KIT A (KITS) ×2 IMPLANT
LABEL OR SOLS (LABEL) ×2 IMPLANT
LOOP RED MAXI  1X406MM (MISCELLANEOUS) ×1
LOOP VESSEL MAXI 1X406 RED (MISCELLANEOUS) ×1 IMPLANT
LOOP VESSEL MINI 0.8X406 BLUE (MISCELLANEOUS) ×1 IMPLANT
LOOPS BLUE MINI 0.8X406MM (MISCELLANEOUS) ×1
MANIFOLD NEPTUNE II (INSTRUMENTS) ×2 IMPLANT
NDL FILTER BLUNT 18X1 1/2 (NEEDLE) ×1 IMPLANT
NEEDLE FILTER BLUNT 18X 1/2SAF (NEEDLE) ×1
NEEDLE FILTER BLUNT 18X1 1/2 (NEEDLE) ×1 IMPLANT
NS IRRIG 500ML POUR BTL (IV SOLUTION) ×2 IMPLANT
PACK EXTREMITY ARMC (MISCELLANEOUS) ×2 IMPLANT
PAD PREP 24X41 OB/GYN DISP (PERSONAL CARE ITEMS) ×2 IMPLANT
SOLUTION CELL SAVER (CLIP) ×1 IMPLANT
STOCKINETTE 48X4 2 PLY STRL (GAUZE/BANDAGES/DRESSINGS) ×1 IMPLANT
STOCKINETTE STRL 4IN 9604848 (GAUZE/BANDAGES/DRESSINGS) ×2 IMPLANT
SUT MNCRL AB 4-0 PS2 18 (SUTURE) ×2 IMPLANT
SUT PROLENE 6 0 BV (SUTURE) ×8 IMPLANT
SUT SILK 2 0 (SUTURE) ×2
SUT SILK 2-0 18XBRD TIE 12 (SUTURE) ×1 IMPLANT
SUT SILK 3 0 (SUTURE) ×2
SUT SILK 3-0 18XBRD TIE 12 (SUTURE) ×1 IMPLANT
SUT SILK 4 0 (SUTURE) ×2
SUT SILK 4-0 18XBRD TIE 12 (SUTURE) ×1 IMPLANT
SUT VIC AB 3-0 SH 27 (SUTURE) ×2
SUT VIC AB 3-0 SH 27X BRD (SUTURE) ×1 IMPLANT
SYR 20ML LL LF (SYRINGE) ×2 IMPLANT
SYR 3ML LL SCALE MARK (SYRINGE) ×2 IMPLANT
SYR TB 1ML 27GX1/2 LL (SYRINGE) IMPLANT

## 2020-02-04 NOTE — Interval H&P Note (Signed)
History and Physical Interval Note:  02/04/2020 12:17 PM  Joyce West  has presented today for surgery, with the diagnosis of ESRD.  The various methods of treatment have been discussed with the patient and family. After consideration of risks, benefits and other options for treatment, the patient has consented to  Procedure(s): ARTERIOVENOUS (AV) FISTULA CREATION (Right) as a surgical intervention.  The patient's history has been reviewed, patient examined, no change in status, stable for surgery.  I have reviewed the patient's chart and labs.  Questions were answered to the patient's satisfaction.     Leotis Pain

## 2020-02-04 NOTE — Anesthesia Preprocedure Evaluation (Signed)
Anesthesia Evaluation  Patient identified by MRN, date of birth, ID band Patient awake    Reviewed: Allergy & Precautions, H&P , NPO status , Patient's Chart, lab work & pertinent test results, reviewed documented beta blocker date and time   History of Anesthesia Complications Negative for: history of anesthetic complications  Airway Mallampati: I  TM Distance: >3 FB Neck ROM: full    Dental  (+) Dental Advidsory Given, Missing, Teeth Intact   Pulmonary neg shortness of breath, neg sleep apnea, neg COPD, Recent URI  (Covid + 3 weeks ago), Resolved, Current Smoker,    Pulmonary exam normal breath sounds clear to auscultation       Cardiovascular Exercise Tolerance: Good hypertension, (-) angina(-) Past MI and (-) Cardiac Stents Normal cardiovascular exam(-) dysrhythmias (-) Valvular Problems/Murmurs Rhythm:regular Rate:Normal     Neuro/Psych neg Seizures PSYCHIATRIC DISORDERS Anxiety CVA, Residual Symptoms    GI/Hepatic negative GI ROS, Neg liver ROS,   Endo/Other  diabetes  Renal/GU ESRF and DialysisRenal disease  negative genitourinary   Musculoskeletal   Abdominal   Peds  Hematology negative hematology ROS (+)   Anesthesia Other Findings Past Medical History: No date: Anxiety No date: Diabetes mellitus without complication (English) [id number]: History of 2019 novel coronavirus disease (COVID-19) No date: Hypertension No date: Ischemic stroke [facility])     Comment:  X3 2021 No date: Pneumonia No date: Renal disorder     Comment:  ESRD, dialysis since may 2021 No date: Right sided weakness     Comment:  secondary to stroke    Reproductive/Obstetrics negative OB ROS                             Anesthesia Physical Anesthesia Plan  ASA: IV  Anesthesia Plan: General   Post-op Pain Management:    Induction: Intravenous  PONV Risk Score and Plan: 2 and Ondansetron, Dexamethasone,  Midazolam, Treatment may vary due to age or medical condition and Promethazine  Airway Management Planned: LMA  Additional Equipment:   Intra-op Plan:   Post-operative Plan: Extubation in OR  Informed Consent: I have reviewed the patients History and Physical, chart, labs and discussed the procedure including the risks, benefits and alternatives for the proposed anesthesia with the patient or authorized representative who has indicated his/her understanding and acceptance.     Dental Advisory Given  Plan Discussed with: Anesthesiologist, CRNA and Surgeon  Anesthesia Plan Comments:         Anesthesia Quick Evaluation

## 2020-02-04 NOTE — Anesthesia Postprocedure Evaluation (Signed)
Anesthesia Post Note  Patient: Joyce West  Procedure(s) Performed: ARTERIOVENOUS (AV) FISTULA CREATION (Right )  Patient location during evaluation: PACU Anesthesia Type: General Level of consciousness: awake and alert Pain management: pain level controlled Vital Signs Assessment: post-procedure vital signs reviewed and stable Respiratory status: spontaneous breathing, nonlabored ventilation, respiratory function stable and patient connected to nasal cannula oxygen Cardiovascular status: blood pressure returned to baseline and stable Postop Assessment: no apparent nausea or vomiting Anesthetic complications: no   No complications documented.   Last Vitals:  Vitals:   02/04/20 1536 02/04/20 1613  BP: (!) 160/98 (!) 155/99  Pulse: 81 79  Resp: 18   Temp: (!) 36.1 C   SpO2: 97%     Last Pain:  Vitals:   02/04/20 1613  TempSrc:   PainSc: 0-No pain                 Martha Clan

## 2020-02-04 NOTE — Transfer of Care (Signed)
Immediate Anesthesia Transfer of Care Note  Patient: Joyce West  Procedure(s) Performed: ARTERIOVENOUS (AV) FISTULA CREATION (Right )  Patient Location: PACU  Anesthesia Type:General  Level of Consciousness: drowsy  Airway & Oxygen Therapy: Patient Spontanous Breathing and Patient connected to face mask oxygen  Post-op Assessment: Report given to RN and Post -op Vital signs reviewed and stable  Post vital signs: Reviewed and stable  Last Vitals:  Vitals Value Taken Time  BP 120/76 02/04/20 1426  Temp    Pulse 78 02/04/20 1429  Resp 26 02/04/20 1429  SpO2 100 % 02/04/20 1429  Vitals shown include unvalidated device data.  Last Pain:  Vitals:   02/04/20 1243  TempSrc:   PainSc: 0-No pain         Complications: No complications documented.

## 2020-02-04 NOTE — Anesthesia Procedure Notes (Signed)
Anesthesia Regional Block: Supraclavicular block   Pre-Anesthetic Checklist: ,, timeout performed, Correct Patient, Correct Site, Correct Laterality, Correct Procedure, Correct Position, site marked, Risks and benefits discussed,  Surgical consent,  Pre-op evaluation,  At surgeon's request and post-op pain management  Laterality: Upper and Right  Prep: chloraprep       Needles:  Injection technique: Single-shot  Needle Type: Stimiplex     Needle Length: 5cm  Needle Gauge: 22     Additional Needles:   Procedures:,,,, ultrasound used (permanent image in chart),,,,  Narrative:  Start time: 02/04/2020 12:48 PM End time: 02/04/2020 12:52 PM Injection made incrementally with aspirations every 5 mL.  Performed by: Personally  Anesthesiologist: Martha Clan, MD  Additional Notes: Functioning IV was confirmed and monitors were applied.  A 52m 22ga Stimuplex needle was used. Sterile prep and drape,hand hygiene and sterile gloves were used.  Negative aspiration and negative test dose prior to incremental administration of local anesthetic. The patient tolerated the procedure well.

## 2020-02-04 NOTE — Op Note (Signed)
[facility]   OPERATIVE NOTE   PROCEDURE: Right radiocephaic arteriovenous fistula placement  PRE-OPERATIVE DIAGNOSIS: 1. ESRD   POST-OPERATIVE DIAGNOSIS: Same  SURGEON: Leotis Pain, MD  ASSISTANT(S): Hezzie Bump, PA-C  ANESTHESIA: general  ESTIMATED BLOOD LOSS: 10 cc  FINDING(S): Adequate cephalic vein and radial artery for fistula creation  SPECIMEN(S):  None  INDICATIONS:   Joyce West is a 30 y.o. female who presents with ESRD.  The patient is scheduled for right radiocephalic arteriovenous fistula placement when non-invasive studies suggested adequate anatomy for fistula creation at this location.  The patient is aware the risks include but are not limited to: bleeding, infection, steal syndrome, nerve damage, ischemic monomelic neuropathy, failure to mature, and need for additional procedures.  The patient is aware of the risks of the procedure and elects to proceed forward.  DESCRIPTION: After full informed written consent was obtained from the patient, the patient was brought back to the operating room and placed supine upon the operating table.  Prior to induction, the patient received IV antibiotics.   After obtaining adequate anesthesia, the patient was then prepped and draped in the standard fashion for a right arm access procedure.  I turned my attention first to identifying the patient's distal cephalic vein and radial artery.  I made an incision at the level of the distal forearm and wrist and dissected through the subcutaneous tissue and fascia to gain exposure of the radial artery.  This was noted to be useable for fistula creation.  This was dissected out proximally and distally and controlled with vessel loops.  I then dissected out the cephalic vein.  This was noted to be patent and adequate size for fistula creation. I then gave the patient 3000 units of intravenous heparin.  The distal segment of the vein was ligated with a  2-0 silk, and  the vein was transected.   I then instilled the heparinized saline into the vein and clamped it.  At this point, I reset my exposure of the radial artery and placed the artery under tension proximally and distally.  I made an arteriotomy with a #11 blade, and then I extended the arteriotomy with a Potts scissor.  I injected heparinized saline proximal and distal to this arteriotomy.  The vein was then sewn to the artery in an end-to-side configuration with a running stitch of 6-0 Prolene.  Prior to completing this anastomosis, I allowed the vein and artery to backbleed.  There was no evidence of clot from any vessels.  I completed the anastomosis in the usual fashion and then released all vessel loops and clamps.  There was a palpable  thrill in the venous outflow, and there was a palpable radial pulse beyond the anastomosis.  At this point, I irrigated out the surgical wound. Surgicel was placed. There was no further active bleeding.  The subcutaneous tissue was reapproximated with a running stitch of 3-0 Vicryl.  The skin was then reapproximated with a running subcuticular stitch of 4-0 Monocryl.  The skin was then cleaned, dried, and reinforced with Dermabond.  The patient tolerated this procedure well and was taken to the recovery room in stable condition  COMPLICATIONS: None  CONDITION: Stable   Leotis Pain, MD 02/04/2020 2:18 PM   This note was created with Dragon Medical transcription system. Any errors in dictation are purely unintentional.

## 2020-02-04 NOTE — Discharge Instructions (Signed)

## 2020-02-05 ENCOUNTER — Encounter: Payer: Self-pay | Admitting: Vascular Surgery

## 2020-02-25 ENCOUNTER — Emergency Department
Admission: EM | Admit: 2020-02-25 | Discharge: 2020-02-25 | Disposition: A | Discharge status: Home / Self Care | Payer: Medicare Other | Attending: Emergency Medicine | Admitting: Emergency Medicine

## 2020-02-25 ENCOUNTER — Emergency Department: Payer: Medicare Other

## 2020-02-25 ENCOUNTER — Other Ambulatory Visit: Payer: Self-pay

## 2020-02-25 DIAGNOSIS — Z79899 Other long term (current) drug therapy: Secondary | ICD-10-CM | POA: Diagnosis not present

## 2020-02-25 DIAGNOSIS — I120 Hypertensive chronic kidney disease with stage 5 chronic kidney disease or end stage renal disease: Secondary | ICD-10-CM | POA: Insufficient documentation

## 2020-02-25 DIAGNOSIS — E1140 Type 2 diabetes mellitus with diabetic neuropathy, unspecified: Secondary | ICD-10-CM | POA: Diagnosis not present

## 2020-02-25 DIAGNOSIS — N186 End stage renal disease: Secondary | ICD-10-CM | POA: Insufficient documentation

## 2020-02-25 DIAGNOSIS — Z8616 Personal history of COVID-19: Secondary | ICD-10-CM | POA: Diagnosis not present

## 2020-02-25 DIAGNOSIS — F17210 Nicotine dependence, cigarettes, uncomplicated: Secondary | ICD-10-CM | POA: Insufficient documentation

## 2020-02-25 DIAGNOSIS — E875 Hyperkalemia: Secondary | ICD-10-CM

## 2020-02-25 DIAGNOSIS — I10 Essential (primary) hypertension: Secondary | ICD-10-CM

## 2020-02-25 DIAGNOSIS — Z992 Dependence on renal dialysis: Secondary | ICD-10-CM | POA: Diagnosis not present

## 2020-02-25 DIAGNOSIS — E1122 Type 2 diabetes mellitus with diabetic chronic kidney disease: Secondary | ICD-10-CM | POA: Diagnosis not present

## 2020-02-25 DIAGNOSIS — Z7982 Long term (current) use of aspirin: Secondary | ICD-10-CM | POA: Insufficient documentation

## 2020-02-25 DIAGNOSIS — E11319 Type 2 diabetes mellitus with unspecified diabetic retinopathy without macular edema: Secondary | ICD-10-CM | POA: Insufficient documentation

## 2020-02-25 DIAGNOSIS — Z7984 Long term (current) use of oral hypoglycemic drugs: Secondary | ICD-10-CM | POA: Diagnosis not present

## 2020-02-25 LAB — CBC WITH DIFFERENTIAL/PLATELET
Abs Immature Granulocytes: 0.02 10*3/uL (ref 0.00–0.07)
Basophils Absolute: 0 10*3/uL (ref 0.0–0.1)
Basophils Relative: 1 %
Eosinophils Absolute: 0.2 10*3/uL (ref 0.0–0.5)
Eosinophils Relative: 3 %
HCT: 24 % — ABNORMAL LOW (ref 36.0–46.0)
Hemoglobin: 7.7 g/dL — ABNORMAL LOW (ref 12.0–15.0)
Immature Granulocytes: 0 %
Lymphocytes Relative: 24 %
Lymphs Abs: 1.6 10*3/uL (ref 0.7–4.0)
MCH: 29.2 pg (ref 26.0–34.0)
MCHC: 32.1 g/dL (ref 30.0–36.0)
MCV: 90.9 fL (ref 80.0–100.0)
Monocytes Absolute: 0.6 10*3/uL (ref 0.1–1.0)
Monocytes Relative: 9 %
Neutro Abs: 4.2 10*3/uL (ref 1.7–7.7)
Neutrophils Relative %: 63 %
Platelets: 274 10*3/uL (ref 150–400)
RBC: 2.64 MIL/uL — ABNORMAL LOW (ref 3.87–5.11)
RDW: 15.8 % — ABNORMAL HIGH (ref 11.5–15.5)
WBC: 6.6 10*3/uL (ref 4.0–10.5)
nRBC: 0 % (ref 0.0–0.2)

## 2020-02-25 LAB — BASIC METABOLIC PANEL
Anion gap: 17 — ABNORMAL HIGH (ref 5–15)
BUN: 100 mg/dL — ABNORMAL HIGH (ref 6–20)
CO2: 15 mmol/L — ABNORMAL LOW (ref 22–32)
Calcium: 9.8 mg/dL (ref 8.9–10.3)
Chloride: 107 mmol/L (ref 98–111)
Creatinine, Ser: 17.87 mg/dL — ABNORMAL HIGH (ref 0.44–1.00)
GFR, Estimated: 2 mL/min — ABNORMAL LOW (ref >60)
Glucose, Bld: 119 mg/dL — ABNORMAL HIGH (ref 70–99)
Potassium: 6.4 mmol/L (ref 3.5–5.1)
Sodium: 139 mmol/L (ref 135–145)

## 2020-02-25 MED ORDER — SODIUM ZIRCONIUM CYCLOSILICATE 10 G PO PACK
10.0000 g | PACK | Freq: Once | ORAL | Status: AC
  Administered 2020-02-25: 10 g via ORAL
  Filled 2020-02-25: qty 1

## 2020-02-25 NOTE — ED Triage Notes (Signed)
Pt to ED stating she has missed past 3 dialysis treatments, she had dialysis last on Wednesday of last week. Pt denies any pain or discomfort. Per davita pt had to come here and get dialysis since she has missed so many.

## 2020-02-25 NOTE — Discharge Instructions (Addendum)
You need dialysis given your blood pressure and potassium are elevated today.  We have talked to your nephrologist who recommended that you be admitted to the hospital for dialysis today.  You have decided to leave [place].  We have discussed risk including Vella pain, worsening symptoms such as shortness of breath, chest pain, palpitations; severe and permanent disability and even death.  If you are unable to get dialysis in the morning as an outpatient, please return to the emergency department.

## 2020-02-25 NOTE — ED Provider Notes (Signed)
[facility] Emergency Department Provider Note  ____________________________________________   Event Date/Time   First MD Initiated Contact with Patient 02/25/20 0132     (approximate)  I have reviewed the triage vital signs and the nursing notes.   HISTORY  Chief Complaint No chief complaint on file.    HPI Joyce West is a 30 y.o. female with history of hypertension, DM, CVA, end-stage renal disease on dialysis since May 2021 who presents to the emergency department requesting dialysis.  States her last dialysis was Wednesday, February 9.  He states she missed Friday the 11th secondary to car trouble.  She missed her next 2 dialysis sessions on Monday the 14th and Wednesday the 16th due to issues with her children.  She states that her dialysis center stated that their policy is that she has to come to the ED if she has missed 3 or more dialysis treatments.  She denies having any chest pain, shortness of breath, palpitations, nausea, vomiting, diarrhea.  She denies any pain at this time.        Past Medical History:  Diagnosis Date  . Anxiety   . Diabetes mellitus without complication ([place])   . History of 2019 novel coronavirus disease (COVID-19) 01/17/2020  . Hypertension   . Ischemic stroke ([place])    X3 2021  . Pneumonia   . Renal disorder    ESRD, dialysis since may 2021  . Right sided weakness    secondary to stroke     Patient Active Problem List   Diagnosis Date Noted  . Bloodstream infection due to central venous catheter 01/12/2020  . Long term (current) use of antibiotics 01/12/2020  . Acute respiratory failure with hypoxia ([place]) 12/24/2019  . ESRD (end stage renal disease) ([place]) 12/24/2019  . MRSA pneumonia ([place]) 12/24/2019  . Bacteremia due to methicillin resistant Staphylococcus aureus 11/27/2019  . Pericarditis 11/27/2019  . HTN (hypertension) 11/27/2019  . Community acquired pneumonia due to methicillin resistant  Staphylococcus aureus (MRSA) ([place]) 11/27/2019  . Volume overload 11/26/2019  . CAP (community acquired pneumonia) 11/01/2019  . Cough 10/30/2019  . Diarrhea 10/23/2019  . Hypertensive emergency 10/23/2019  . Myalgia 10/23/2019  . 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 Fever 07/07/2019  . Nausea and vomiting 07/07/2019  . Encounter for fitting or adjustment of peritoneal dialysis catheter ([place]) 06/25/2019  . Ischemic stroke ([place]) 04/29/2019  . AKI (acute kidney injury) ([place]) 04/22/2019  . Hypertension   . Type 2 diabetes mellitus with stage 3 chronic kidney disease ([place])   . Anxiety in pregnancy, antepartum 04/09/2018  . Obesity affecting pregnancy in second trimester 04/09/2018  . Hypertension during pregnancy in second trimester 04/08/2018  . Type 2 diabetes mellitus ([place]) 04/08/2018  . Urinary tract infection in mother during first trimester of pregnancy 02/05/2018  . Supervision of high risk pregnancy in first trimester 02/02/2018  . Nephrotic syndrome 10/29/2017  . Anxiety 07/11/2015  . History of pre-eclampsia in prior pregnancy, currently pregnant in first trimester 07/10/2015  . History of uterine scar from previous surgery 07/10/2015  . Chronic hypertension in pregnancy 07/10/2015  . Former smoker 02/04/2013  . Diabetic nephropathy ([place]) 02/02/2013  . Diabetic retinopathy ([place]) 02/02/2013  . Pre-existing type 2 diabetes mellitus during pregnancy in first trimester 04/28/2012    Past Surgical History:  Procedure Laterality Date  . AV FISTULA PLACEMENT Right 02/04/2020   Procedure: ARTERIOVENOUS (AV) FISTULA CREATION;  Surgeon: Algernon Huxley, MD;  Location: [facility] ORS;  Service: Vascular;  Laterality: Right;  . CESAREAN SECTION     x 3  . DIALYSIS/PERMA CATHETER INSERTION N/A 05/29/2019   Procedure: DIALYSIS/PERMA CATHETER INSERTION;  Surgeon: Algernon Huxley, MD;  Location: [facility];  Service: Cardiovascular;  Laterality: N/A;   . DIALYSIS/PERMA CATHETER INSERTION N/A 11/02/2019   Procedure: DIALYSIS/PERMA CATHETER INSERTION;  Surgeon: Algernon Huxley, MD;  Location: [facility];  Service: Cardiovascular;  Laterality: N/A;  . TUBAL LIGATION      Prior to Admission medications   Medication Sig Start Date End Date Taking? Authorizing Provider  albuterol (VENTOLIN HFA) 108 (90 Base) MCG/ACT inhaler Inhale 2 puffs into the lungs every 4 (four) hours as needed. 11/13/19   [provider]  amLODipine (NORVASC) 10 MG tablet Take 10 mg by mouth daily. 11/01/19   [provider]  aspirin 81 MG chewable tablet Chew by mouth daily.    [provider]  atorvastatin (LIPITOR) 80 MG tablet Take 80 mg by mouth daily. Patient does not know what does she takes    [provider]  calcium acetate (PHOSLO) 667 MG capsule Take 1,334 mg by mouth 3 (three) times daily. 12/11/19   [provider]  carvedilol (COREG) 25 MG tablet Take 25 mg by mouth 2 (two) times daily. 12/02/19   [provider]  cloNIDine (CATAPRES) 0.2 MG tablet Take 0.2 mg by mouth 2 (two) times daily.    [provider]  furosemide (LASIX) 40 MG tablet Take 40 mg by mouth 3 (three) times daily. PT TAKES ONE TIME A DAY AT NIGHT 08/13/19   [provider]  gabapentin (NEURONTIN) 300 MG capsule Take 300 mg by mouth daily. 12/04/19   [provider]  gentamicin cream (GARAMYCIN) 0.1 % Apply 1 application topically 2 (two) times daily. 11/18/19   [provider]  guaiFENesin-codeine 100-10 MG/5ML syrup Take by mouth. 11/13/19   [provider]  hydrALAZINE (APRESOLINE) 100 MG tablet Take 100 mg by mouth 3 (three) times daily. PT TAKES ONE MORNING AND ONE AT NIGHT    [provider]  HYDROcodone-acetaminophen (NORCO) 5-325 MG tablet Take 1 tablet by mouth every 6 (six) hours as needed for moderate pain. 02/04/20   Algernon Huxley, MD  hydrOXYzine (ATARAX/VISTARIL) 25 MG  tablet Take 25 mg by mouth every 12 (twelve) hours as needed. 12/02/19   [provider]  isosorbide mononitrate (IMDUR) 30 MG 24 hr tablet Take 30 mg by mouth daily.    [provider]  liraglutide (VICTOZA) 18 MG/3ML SOPN Inject 1.8 mg into the skin daily.    [provider]  losartan (COZAAR) 100 MG tablet Take 100 mg by mouth daily. 12/11/19   [provider]  melatonin 3 MG TABS tablet Take by mouth. 08/10/19   [provider]  polyethylene glycol (MIRALAX / GLYCOLAX) 17 g packet Take by mouth. 08/11/19   [provider]  traMADol (ULTRAM) 50 MG tablet Take 1 tablet (50 mg total) by mouth every 6 (six) hours as needed. 04/22/19   Caryn Section [place] Dolin, PA-C    Allergies Enalapril and Lisinopril  No family history on file.  Social History Social History   Tobacco Use  . Smoking status: Current Every Day Smoker    Types: Cigarettes  . Smokeless tobacco: Never Used  Vaping Use  . Vaping Use: Never used  Substance Use Topics  . Alcohol use: Not Currently  . Drug use:  Never    Review of Systems Constitutional: No fever. Eyes: No visual changes. ENT: No sore throat. Cardiovascular: Denies chest pain. Respiratory: Denies shortness of breath. Gastrointestinal: No nausea, vomiting, diarrhea. Genitourinary: Negative for dysuria. Musculoskeletal: Negative for back pain. Skin: Negative for rash. Neurological: Negative for focal weakness or numbness.  ____________________________________________   PHYSICAL EXAM:  VITAL SIGNS: ED Triage Vitals [02/25/20 0042]  Enc Vitals Group     BP (!) 186/121     Pulse Rate 96     Resp 18     Temp 98.1 F (36.7 C)     Temp Source Oral     SpO2 99 %     Weight 145 lb (65.8 kg)     Height '5\' 4"'$  (1.626 m)     Head Circumference      Peak Flow      Pain Score 0     Pain Loc      Pain Edu?      Excl. in [place]?    CONSTITUTIONAL: Alert and oriented and responds appropriately to questions.  Well-appearing; well-nourished HEAD: Normocephalic EYES: Conjunctivae clear, pupils appear equal, EOM appear intact ENT: normal nose; moist mucous membranes NECK: Supple, normal ROM CARD: RRR; S1 and S2 appreciated; no murmurs, no clicks, no rubs, no gallops; tunneled catheter noted to the left chest wall without surrounding redness, warmth or drainage RESP: Normal chest excursion without splinting or tachypnea; breath sounds clear and equal bilaterally; no wheezes, no rhonchi, no rales, no hypoxia or respiratory distress, speaking full sentences ABD/GI: Normal bowel sounds; non-distended; soft, non-tender, no rebound, no guarding, no peritoneal signs, no hepatosplenomegaly BACK: The back appears normal EXT: Normal ROM in all joints; no deformity noted, no edema; no cyanosis; fistula healing in the left wrist without associated redness, drainage, bleeding or swelling SKIN: Normal color for age and race; warm; no rash on exposed skin NEURO: Moves all extremities equally PSYCH: The patient's mood and manner are appropriate.  ____________________________________________   LABS (all labs ordered are listed, but only abnormal results are displayed)  Labs Reviewed  CBC WITH DIFFERENTIAL/PLATELET - Abnormal; Notable for the following components:      Result Value   RBC 2.64 (*)    Hemoglobin 7.7 (*)    HCT 24.0 (*)    RDW 15.8 (*)    All other components within normal limits  BASIC METABOLIC PANEL - Abnormal; Notable for the following components:   Potassium 6.4 (*)    CO2 15 (*)    Glucose, Bld 119 (*)    BUN 100 (*)    Creatinine, Ser 17.87 (*)    GFR, Estimated 2 (*)    Anion gap 17 (*)    All other components within normal limits   ____________________________________________  EKG   EKG Interpretation  Date/Time:  Thursday February 25 2020 00:42:41 EST Ventricular Rate:  93 PR Interval:  170 QRS Duration: 62 QT Interval:  344 QTC Calculation: 427 R Axis:   -20 Text  Interpretation: Normal sinus rhythm Low voltage QRS Septal infarct , age undetermined Abnormal ECG Confirmed by Pryor Curia [phone]) on 02/25/2020 1:45:23 AM       ____________________________________________  RADIOLOGY Jessie Foot Caelynn Marshman, personally viewed and evaluated these images (plain radiographs) as part of my medical decision making, as well as reviewing the written report by the radiologist.  ED MD interpretation: Right pleural effusion.  No pulmonary edema.  Official radiology report(s): DG Chest 2 View  Result Date: 02/25/2020 CLINICAL DATA:  Missed dialysis. EXAM: CHEST - 2 VIEW COMPARISON:  Chest x-ray 12/24/2019 FINDINGS: Left chest wall dialysis catheter with tips in the region the superior caval junction. The heart size and mediastinal contours are unchanged. Right base atelectasis. No focal consolidation. No pulmonary edema. Persistent small right pleural effusion. Interval resolution of left trace pleural effusion. No pneumothorax. No acute osseous abnormality. IMPRESSION: Persistent small right pleural effusion with associated cardiomegaly. Electronically Signed   By: Iven Finn M.D.   On: 02/25/2020 02:04    ____________________________________________   PROCEDURES  Procedure(s) performed (including Critical Care):  Procedures  CRITICAL CARE Performed by: Cyril Mourning Carin Shipp   Total critical care time: 45 minutes  Critical care time was exclusive of separately billable procedures and treating other patients.  Critical care was necessary to treat or prevent imminent or life-threatening deterioration.  Critical care was time spent personally by me on the following activities: development of treatment plan with patient and/or surrogate as well as nursing, discussions with consultants, evaluation of patient's response to treatment, examination of patient, obtaining history from patient or surrogate, ordering and performing treatments and interventions, ordering and  review of laboratory studies, ordering and review of radiographic studies, pulse oximetry and re-evaluation of patient's condition.  ____________________________________________   INITIAL IMPRESSION / ASSESSMENT AND PLAN / ED COURSE  As part of my medical decision making, I reviewed the following data within the [facility] notes reviewed and incorporated, Labs reviewed, EKG interpreted NSR, Old EKG reviewed, Radiograph reviewed, A consult was requested and obtained from this/these consultant(s) nephrology and Notes from prior ED visits         Patient here after she has missed 3 dialysis sessions.  She is asymptomatic.  Patient noted to be hypertensive and hyperkalemic without EKG changes.  She states that she was told by her dialysis center that she needed to come to the ED to have dialysis and she is requesting this be done now.  Discussed with patient that this would not likely happen in the middle of the night given she is asymptomatic, well-appearing and without EKG changes but would likely happen later today and I suspect that she will need to be admitted to the hospital.  She states that she is unable to do this at this time stating that she has childcare issues and will need to take her son to school in the morning.  Will discuss with her nephrologist but anticipate that patient will be leaving [place].  We have discussed the importance of dialysis and critical nature of her hyperkalemia and hypertension.  ED PROGRESS  1:52 AM  Spoke with Dr. Juleen China on-call for nephrology.  He recommends admission to medicine given hypertension and hyperkalemia.  We will plan to dialyze today but she does not need it emergently.  2:11 AM Patient has decided to leave against medical advice without further workup, treatment.  She states that she has to take her son to school in the morning at 730 and cannot stay for admission or dialysis later today.  We have  discussed risk with her hypertension and hyperkalemia.  She states she plans to call [facility] to see if she can get dialysis done in the morning as an outpatient.  If not she states she will return to the emergency department.  I have sent a secure chat to Dr. Juleen China.  Will give dose of Lokelma as a temporizing measure prior to discharge.    We discussed the nature and purpose, risks  and benefits, as well as, the alternatives of treatment. Time was given to allow the opportunity to ask questions and consider their options, and after the discussion, the patient decided to refuse further testing and/or offered treatment. The patient was informed that refusal could lead to, but was not limited to, death, permanent disability, severe pain, worsening symptoms, disease progression. If present, I asked friends/family to dissuade them without success. Prior to refusing, I determined that the patient had the capacity to make their own decision, does not appear clinically intoxicated and understood the consequences of that decision and was able to verbalize them back to me. Outpatient follow up has been encouraged and provided as needed.  Recommended patient return to the hospital if symptoms worsen, change or they would like further treatment and evaluation.  ____________________________________________   FINAL CLINICAL IMPRESSION(S) / ED DIAGNOSES  Final diagnoses:  ESRD needing dialysis ([place])  Hyperkalemia  Hypertension, unspecified type     ED Discharge Orders    None      *Please note:  Naidelin Hauenstein was evaluated in Emergency Department on 02/25/2020 for the symptoms described in the history of present illness. She was evaluated in the context of the global COVID-19 pandemic, which necessitated consideration that the patient might be at risk for infection with the SARS-CoV-2 virus that causes COVID-19. Institutional protocols and algorithms that pertain to the evaluation of patients at risk for  COVID-19 are in a state of rapid change based on information released by regulatory bodies including the CDC and federal and state organizations. These policies and algorithms were followed during the patient's care in the ED.  Some ED evaluations and interventions may be delayed as a result of limited staffing during and the pandemic.*   Note:  This document was prepared using Dragon voice recognition software and may include unintentional dictation errors.   Kaydee Magel, Delice Bison, DO 02/25/20 0214

## 2020-02-25 NOTE — ED Notes (Signed)
Pt declined final set of vitals prior to leaving AMA.

## 2020-03-08 DIAGNOSIS — N898 Other specified noninflammatory disorders of vagina: Secondary | ICD-10-CM | POA: Insufficient documentation

## 2020-03-08 DIAGNOSIS — N930 Postcoital and contact bleeding: Secondary | ICD-10-CM | POA: Insufficient documentation

## 2020-03-08 DIAGNOSIS — M25551 Pain in right hip: Secondary | ICD-10-CM | POA: Insufficient documentation

## 2020-03-08 DIAGNOSIS — W19XXXA Unspecified fall, initial encounter: Secondary | ICD-10-CM | POA: Insufficient documentation

## 2020-03-08 DIAGNOSIS — M25511 Pain in right shoulder: Secondary | ICD-10-CM | POA: Insufficient documentation

## 2020-03-23 ENCOUNTER — Other Ambulatory Visit (INDEPENDENT_AMBULATORY_CARE_PROVIDER_SITE_OTHER): Payer: Self-pay | Admitting: Vascular Surgery

## 2020-03-23 DIAGNOSIS — Z9889 Other specified postprocedural states: Secondary | ICD-10-CM

## 2020-03-23 DIAGNOSIS — N186 End stage renal disease: Secondary | ICD-10-CM

## 2020-03-24 ENCOUNTER — Encounter (INDEPENDENT_AMBULATORY_CARE_PROVIDER_SITE_OTHER): Payer: Medicare Other

## 2020-03-24 ENCOUNTER — Ambulatory Visit (INDEPENDENT_AMBULATORY_CARE_PROVIDER_SITE_OTHER): Payer: Medicare Other | Admitting: Nurse Practitioner

## 2020-03-24 ENCOUNTER — Encounter (INDEPENDENT_AMBULATORY_CARE_PROVIDER_SITE_OTHER): Payer: Self-pay | Admitting: Nurse Practitioner

## 2020-03-24 ENCOUNTER — Ambulatory Visit (INDEPENDENT_AMBULATORY_CARE_PROVIDER_SITE_OTHER): Payer: Medicare Other

## 2020-03-24 ENCOUNTER — Other Ambulatory Visit: Payer: Self-pay

## 2020-03-24 VITALS — BP 155/100 | HR 85 | Ht 64.0 in | Wt 145.0 lb

## 2020-03-24 DIAGNOSIS — Z9889 Other specified postprocedural states: Secondary | ICD-10-CM

## 2020-03-24 DIAGNOSIS — I10 Essential (primary) hypertension: Secondary | ICD-10-CM

## 2020-03-24 DIAGNOSIS — Z992 Dependence on renal dialysis: Secondary | ICD-10-CM

## 2020-03-24 DIAGNOSIS — N186 End stage renal disease: Secondary | ICD-10-CM

## 2020-03-24 DIAGNOSIS — E1122 Type 2 diabetes mellitus with diabetic chronic kidney disease: Secondary | ICD-10-CM

## 2020-03-30 ENCOUNTER — Telehealth (INDEPENDENT_AMBULATORY_CARE_PROVIDER_SITE_OTHER): Payer: Self-pay

## 2020-03-30 NOTE — Telephone Encounter (Signed)
I attempted to contact the patient to schedule right arm fistulagram with Dr. Lucky Cowboy. A message was left for a return call.

## 2020-04-03 ENCOUNTER — Encounter (INDEPENDENT_AMBULATORY_CARE_PROVIDER_SITE_OTHER): Payer: Self-pay | Admitting: Nurse Practitioner

## 2020-04-03 NOTE — Progress Notes (Signed)
Subjective:    Patient ID: Joyce West, female    DOB: [birth year], 30 y.o.   MRN: [id number] Chief Complaint  Patient presents with  . Follow-up    6 wk postAV fistula creation     The patient presents today for first-time evaluation of her right brachiocephalic AV fistula.  The patient notes that she had no issues following her surgery.  The incision has healed well.  All the patient has a good flow volume of 1114 today, the fistula is not readily palpable.  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  There also appears to be some elevated velocities at the distal forearm.  The patient denies any fever or chills.  She also notes that her PermCath is doing well and there are no issues currently with dialysis.   Review of Systems  All other systems reviewed and are negative.      Objective:   Physical Exam Vitals reviewed.  HENT:     Head: Normocephalic.  Cardiovascular:     Rate and Rhythm: Normal rate and regular rhythm.     Pulses: Normal pulses.  Pulmonary:     Effort: Pulmonary effort is normal.     Breath sounds: Normal breath sounds.  Neurological:     Mental Status: She is alert and oriented to person, place, and time.  Psychiatric:        Mood and Affect: Mood normal.        Behavior: Behavior normal.        Thought Content: Thought content normal.        Judgment: Judgment normal.     BP (!) 155/100   Pulse 85   Ht '5\' 4"'$  (1.626 m)   Wt 145 lb (65.8 kg)   BMI 24.89 kg/m   Past Medical History:  Diagnosis Date  . Anxiety   . Diabetes mellitus without complication ([place])   . History of 2019 novel coronavirus disease (COVID-19) 01/17/2020  . Hypertension   . Ischemic stroke ([place])    X3 2021  . Pneumonia   . Renal disorder    ESRD, dialysis since may 2021  . Right sided weakness    secondary to stroke     Social History   Socioeconomic History  . Marital status: Single    Spouse name: Not  on file  . Number of children: Not on file  . Years of education: Not on file  . Highest education level: Not on file  Occupational History  . Not on file  Tobacco Use  . Smoking status: Current Every Day Smoker    Types: Cigarettes  . Smokeless tobacco: Never Used  Vaping Use  . Vaping Use: Never used  Substance and Sexual Activity  . Alcohol use: Not Currently  . Drug use: Never  . Sexual activity: Not on file  Other Topics Concern  . Not on file  Social History Narrative  . Not on file   Social Determinants of Health   Financial Resource Strain: Not on file  Food Insecurity: Not on file  Transportation Needs: Not on file  Physical Activity: Not on file  Stress: Not on file  Social Connections: Not on file  Intimate Partner Violence: Not on file    Past Surgical History:  Procedure Laterality Date  . AV FISTULA PLACEMENT Right 02/04/2020   Procedure: ARTERIOVENOUS (AV) FISTULA CREATION;  Surgeon: Algernon Huxley, MD;  Location: [facility] ORS;  Service: Vascular;  Laterality: Right;  . CESAREAN SECTION     x 3  . DIALYSIS/PERMA CATHETER INSERTION N/A 05/29/2019   Procedure: DIALYSIS/PERMA CATHETER INSERTION;  Surgeon: Algernon Huxley, MD;  Location: [facility];  Service: Cardiovascular;  Laterality: N/A;  . DIALYSIS/PERMA CATHETER INSERTION N/A 11/02/2019   Procedure: DIALYSIS/PERMA CATHETER INSERTION;  Surgeon: Algernon Huxley, MD;  Location: [facility];  Service: Cardiovascular;  Laterality: N/A;  . TUBAL LIGATION      History reviewed. No pertinent family history.  Allergies  Allergen Reactions  . Enalapril     cough  . Lisinopril Cough    CBC Latest Ref Rng & Units 02/25/2020 02/04/2020 02/03/2020  WBC 4.0 - 10.5 K/uL 6.6 - 6.8  Hemoglobin 12.0 - 15.0 g/dL 7.7(L) 8.8(L) 8.6(L)  Hematocrit 36.0 - 46.0 % 24.0(L) 26.0(L) 26.6(L)  Platelets 150 - 400 K/uL 274 - 360      CMP     Component Value Date/Time   NA 139 02/25/2020 0049   NA 136 04/13/2014  2026   K 6.4 (HH) 02/25/2020 0049   K 3.7 04/13/2014 2026   CL 107 02/25/2020 0049   CL 103 04/13/2014 2026   CO2 15 (L) 02/25/2020 0049   CO2 26 04/13/2014 2026   GLUCOSE 119 (H) 02/25/2020 0049   GLUCOSE 241 (H) 04/13/2014 2026   BUN 100 (H) 02/25/2020 0049   BUN 15 04/13/2014 2026   CREATININE 17.87 (H) 02/25/2020 0049   CREATININE 0.80 04/13/2014 2026   CALCIUM 9.8 02/25/2020 0049   CALCIUM 9.1 04/13/2014 2026   PROT 8.3 (H) 12/24/2019 1933   ALBUMIN 3.7 12/24/2019 1933   AST 24 12/24/2019 1933   ALT 17 12/24/2019 1933   ALKPHOS 110 12/24/2019 1933   BILITOT 1.3 (H) 12/24/2019 1933   GFRNONAA 2 (L) 02/25/2020 0049   GFRNONAA >60 04/13/2014 2026   GFRAA 12 (L) 04/28/2019 1435   GFRAA >60 04/13/2014 2026     No results found.     Assessment & Plan:   1. ESRD (end stage renal disease) ([place]) Recommend:  The patient is experiencing increasing problems with their dialysis access.  Patient should have a fistulagram with the intention for intervention.  The intention for intervention is to restore appropriate flow and prevent thrombosis and possible loss of the access.  As well as improve the quality of dialysis therapy.  We will also evaluate for possible accessory veins causing maturation delay.  The risks, benefits and alternative therapies were reviewed in detail with the patient.  All questions were answered.  The patient agrees to proceed with angio/intervention.      2. Type 2 diabetes mellitus with chronic kidney disease on chronic dialysis, unspecified whether long term insulin use ([place]) Continue hypoglycemic medications as already ordered, these medications have been reviewed and there are no changes at this time.  Hgb A1C to be monitored as already arranged by primary service   3. Primary hypertension Continue antihypertensive medications as already ordered, these medications have been reviewed and there are no changes at this time.    Current Outpatient  Medications on File Prior to Visit  Medication Sig Dispense Refill  . albuterol (VENTOLIN HFA) 108 (90 Base) MCG/ACT inhaler Inhale 2 puffs into the lungs every 4 (four) hours as needed.    Marland Kitchen amLODipine (NORVASC) 10 MG tablet Take 10 mg by mouth daily.    Marland Kitchen aspirin 81 MG chewable tablet Chew by mouth daily.    Marland Kitchen  atorvastatin (LIPITOR) 80 MG tablet Take 80 mg by mouth daily. Patient does not know what does she takes    . calcium acetate (PHOSLO) 667 MG capsule Take 1,334 mg by mouth 3 (three) times daily.    . carvedilol (COREG) 25 MG tablet Take 25 mg by mouth 2 (two) times daily.    . cloNIDine (CATAPRES) 0.2 MG tablet Take 0.2 mg by mouth 2 (two) times daily.    . furosemide (LASIX) 40 MG tablet Take 40 mg by mouth 3 (three) times daily. PT TAKES ONE TIME A DAY AT NIGHT    . gabapentin (NEURONTIN) 300 MG capsule Take 300 mg by mouth daily.    Marland Kitchen gentamicin cream (GARAMYCIN) 0.1 % Apply 1 application topically 2 (two) times daily.    Marland Kitchen guaiFENesin-codeine 100-10 MG/5ML syrup Take by mouth.    . hydrALAZINE (APRESOLINE) 100 MG tablet Take 100 mg by mouth 3 (three) times daily. PT TAKES ONE MORNING AND ONE AT NIGHT    . HYDROcodone-acetaminophen (NORCO) 5-325 MG tablet Take 1 tablet by mouth every 6 (six) hours as needed for moderate pain. 30 tablet 0  . hydrOXYzine (ATARAX/VISTARIL) 25 MG tablet Take 25 mg by mouth every 12 (twelve) hours as needed.    . isosorbide mononitrate (IMDUR) 30 MG 24 hr tablet Take 30 mg by mouth daily.    Marland Kitchen liraglutide (VICTOZA) 18 MG/3ML SOPN Inject 1.8 mg into the skin daily.    Marland Kitchen losartan (COZAAR) 100 MG tablet Take 100 mg by mouth daily.    . melatonin 3 MG TABS tablet Take by mouth.    . polyethylene glycol (MIRALAX / GLYCOLAX) 17 g packet Take by mouth.    . traMADol (ULTRAM) 50 MG tablet Take 1 tablet (50 mg total) by mouth every 6 (six) hours as needed. 15 tablet 0   No current facility-administered medications on file prior to visit.    There are no  Patient Instructions on file for this visit. No follow-ups on file.   Kris Hartmann, NP

## 2020-04-03 NOTE — H&P (View-Only) (Signed)
Subjective:    Patient ID: Joyce West, female    DOB: 27-Jul-1990, 30 y.o.   MRN: [id number] Chief Complaint  Patient presents with  . Follow-up    6 wk postAV fistula creation     The patient presents today for first-time evaluation of her right brachiocephalic AV fistula.  The patient notes that she had no issues following her surgery.  The incision has healed well.  All the patient has a good flow volume of 1114 today, the fistula is not readily palpable.  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  There also appears to be some elevated velocities at the distal forearm.  The patient denies any fever or chills.  She also notes that her PermCath is doing well and there are no issues currently with dialysis.   Review of Systems  All other systems reviewed and are negative.      Objective:   Physical Exam Vitals reviewed.  HENT:     Head: Normocephalic.  Cardiovascular:     Rate and Rhythm: Normal rate and regular rhythm.     Pulses: Normal pulses.  Pulmonary:     Effort: Pulmonary effort is normal.     Breath sounds: Normal breath sounds.  Neurological:     Mental Status: She is alert and oriented to person, place, and time.  Psychiatric:        Mood and Affect: Mood normal.        Behavior: Behavior normal.        Thought Content: Thought content normal.        Judgment: Judgment normal.     BP (!) 155/100   Pulse 85   Ht '5\' 4"'$  (1.626 m)   Wt 145 lb (65.8 kg)   BMI 24.89 kg/m   Past Medical History:  Diagnosis Date  . Anxiety   . Diabetes mellitus without complication ([place])   . History of 2019 novel coronavirus disease (COVID-19) 01/17/2020  . Hypertension   . Ischemic stroke ([place])    X3 2021  . Pneumonia   . Renal disorder    ESRD, dialysis since may 2021  . Right sided weakness    secondary to stroke     Social History   Socioeconomic History  . Marital status: Single    Spouse name: Not  on file  . Number of children: Not on file  . Years of education: Not on file  . Highest education level: Not on file  Occupational History  . Not on file  Tobacco Use  . Smoking status: Current Every Day Smoker    Types: Cigarettes  . Smokeless tobacco: Never Used  Vaping Use  . Vaping Use: Never used  Substance and Sexual Activity  . Alcohol use: Not Currently  . Drug use: Never  . Sexual activity: Not on file  Other Topics Concern  . Not on file  Social History Narrative  . Not on file   Social Determinants of Health   Financial Resource Strain: Not on file  Food Insecurity: Not on file  Transportation Needs: Not on file  Physical Activity: Not on file  Stress: Not on file  Social Connections: Not on file  Intimate Partner Violence: Not on file    Past Surgical History:  Procedure Laterality Date  . AV FISTULA PLACEMENT Right 02/04/2020   Procedure: ARTERIOVENOUS (AV) FISTULA CREATION;  Surgeon: Algernon Huxley, MD;  Location: [facility];  Service: Vascular;  Laterality: Right;  . CESAREAN SECTION     x 3  . DIALYSIS/PERMA CATHETER INSERTION N/A 05/29/2019   Procedure: DIALYSIS/PERMA CATHETER INSERTION;  Surgeon: Algernon Huxley, MD;  Location: [facility];  Service: Cardiovascular;  Laterality: N/A;  . DIALYSIS/PERMA CATHETER INSERTION N/A 11/02/2019   Procedure: DIALYSIS/PERMA CATHETER INSERTION;  Surgeon: Algernon Huxley, MD;  Location: [facility];  Service: Cardiovascular;  Laterality: N/A;  . TUBAL LIGATION      History reviewed. No pertinent family history.  Allergies  Allergen Reactions  . Enalapril     cough  . Lisinopril Cough    CBC Latest Ref Rng & Units 02/25/2020 02/04/2020 02/03/2020  WBC 4.0 - 10.5 K/uL 6.6 - 6.8  Hemoglobin 12.0 - 15.0 g/dL 7.7(L) 8.8(L) 8.6(L)  Hematocrit 36.0 - 46.0 % 24.0(L) 26.0(L) 26.6(L)  Platelets 150 - 400 K/uL 274 - 360      CMP     Component Value Date/Time   NA 139 02/25/2020 0049   NA 136 04/13/2014  2026   K 6.4 (HH) 02/25/2020 0049   K 3.7 04/13/2014 2026   CL 107 02/25/2020 0049   CL 103 04/13/2014 2026   CO2 15 (L) 02/25/2020 0049   CO2 26 04/13/2014 2026   GLUCOSE 119 (H) 02/25/2020 0049   GLUCOSE 241 (H) 04/13/2014 2026   BUN 100 (H) 02/25/2020 0049   BUN 15 04/13/2014 2026   CREATININE 17.87 (H) 02/25/2020 0049   CREATININE 0.80 04/13/2014 2026   CALCIUM 9.8 02/25/2020 0049   CALCIUM 9.1 04/13/2014 2026   PROT 8.3 (H) 12/24/2019 1933   ALBUMIN 3.7 12/24/2019 1933   AST 24 12/24/2019 1933   ALT 17 12/24/2019 1933   ALKPHOS 110 12/24/2019 1933   BILITOT 1.3 (H) 12/24/2019 1933   GFRNONAA 2 (L) 02/25/2020 0049   GFRNONAA >60 04/13/2014 2026   GFRAA 12 (L) 04/28/2019 1435   GFRAA >60 04/13/2014 2026     No results found.     Assessment & Plan:   1. ESRD (end stage renal disease) ([place]) Recommend:  The patient is experiencing increasing problems with their dialysis access.  Patient should have a fistulagram with the intention for intervention.  The intention for intervention is to restore appropriate flow and prevent thrombosis and possible loss of the access.  As well as improve the quality of dialysis therapy.  We will also evaluate for possible accessory veins causing maturation delay.  The risks, benefits and alternative therapies were reviewed in detail with the patient.  All questions were answered.  The patient agrees to proceed with angio/intervention.      2. Type 2 diabetes mellitus with chronic kidney disease on chronic dialysis, unspecified whether long term insulin use ([place]) Continue hypoglycemic medications as already ordered, these medications have been reviewed and there are no changes at this time.  Hgb A1C to be monitored as already arranged by primary service   3. Primary hypertension Continue antihypertensive medications as already ordered, these medications have been reviewed and there are no changes at this time.    Current Outpatient  Medications on File Prior to Visit  Medication Sig Dispense Refill  . albuterol (VENTOLIN HFA) 108 (90 Base) MCG/ACT inhaler Inhale 2 puffs into the lungs every 4 (four) hours as needed.    Marland Kitchen amLODipine (NORVASC) 10 MG tablet Take 10 mg by mouth daily.    Marland Kitchen aspirin 81 MG chewable tablet Chew by mouth daily.    Marland Kitchen  atorvastatin (LIPITOR) 80 MG tablet Take 80 mg by mouth daily. Patient does not know what does she takes    . calcium acetate (PHOSLO) 667 MG capsule Take 1,334 mg by mouth 3 (three) times daily.    . carvedilol (COREG) 25 MG tablet Take 25 mg by mouth 2 (two) times daily.    . cloNIDine (CATAPRES) 0.2 MG tablet Take 0.2 mg by mouth 2 (two) times daily.    . furosemide (LASIX) 40 MG tablet Take 40 mg by mouth 3 (three) times daily. PT TAKES ONE TIME A DAY AT NIGHT    . gabapentin (NEURONTIN) 300 MG capsule Take 300 mg by mouth daily.    Marland Kitchen gentamicin cream (GARAMYCIN) 0.1 % Apply 1 application topically 2 (two) times daily.    Marland Kitchen guaiFENesin-codeine 100-10 MG/5ML syrup Take by mouth.    . hydrALAZINE (APRESOLINE) 100 MG tablet Take 100 mg by mouth 3 (three) times daily. PT TAKES ONE MORNING AND ONE AT NIGHT    . HYDROcodone-acetaminophen (NORCO) 5-325 MG tablet Take 1 tablet by mouth every 6 (six) hours as needed for moderate pain. 30 tablet 0  . hydrOXYzine (ATARAX/VISTARIL) 25 MG tablet Take 25 mg by mouth every 12 (twelve) hours as needed.    . isosorbide mononitrate (IMDUR) 30 MG 24 hr tablet Take 30 mg by mouth daily.    Marland Kitchen liraglutide (VICTOZA) 18 MG/3ML SOPN Inject 1.8 mg into the skin daily.    Marland Kitchen losartan (COZAAR) 100 MG tablet Take 100 mg by mouth daily.    . melatonin 3 MG TABS tablet Take by mouth.    . polyethylene glycol (MIRALAX / GLYCOLAX) 17 g packet Take by mouth.    . traMADol (ULTRAM) 50 MG tablet Take 1 tablet (50 mg total) by mouth every 6 (six) hours as needed. 15 tablet 0   No current facility-administered medications on file prior to visit.    There are no  Patient Instructions on file for this visit. No follow-ups on file.   Kris Hartmann, NP

## 2020-04-06 NOTE — Telephone Encounter (Signed)
Received a call from [place] at [facility] regarding getting the patient scheduled for a right arm fistulagram with Dr. Lucky Cowboy. Patient was with Anderson Malta and agreed to 04/14/20 with a 10:15 a arrival time to the MM. Covid testing on 04/12/20 between 8-2 pm at the [facility]. Pre-procedure instructions were discussed and will be faxed to [facility].

## 2020-04-12 ENCOUNTER — Other Ambulatory Visit: Admission: RE | Admit: 2020-04-12 | Payer: Medicare Other | Source: Ambulatory Visit

## 2020-04-14 DIAGNOSIS — N186 End stage renal disease: Secondary | ICD-10-CM

## 2020-04-15 ENCOUNTER — Telehealth (INDEPENDENT_AMBULATORY_CARE_PROVIDER_SITE_OTHER): Payer: Self-pay

## 2020-04-15 NOTE — Telephone Encounter (Signed)
Patient called on 04/14/20 to reschedule her procedure with Dr. Lucky Cowboy on 04/07/2. Patient has been rescheduled to 04/21/20 with a 10:30 am arrival time to the MM. Covid testing on 04/19/20 between 8-2 pm at the [facility]. Pre-procedure instructions will be mailed.

## 2020-04-19 ENCOUNTER — Other Ambulatory Visit
Admission: RE | Admit: 2020-04-19 | Discharge: 2020-04-19 | Disposition: A | Discharge status: Home / Self Care | Payer: Medicare Other | Source: Ambulatory Visit | Attending: Vascular Surgery | Admitting: Vascular Surgery

## 2020-04-19 ENCOUNTER — Other Ambulatory Visit: Payer: Self-pay

## 2020-04-19 DIAGNOSIS — Z01812 Encounter for preprocedural laboratory examination: Secondary | ICD-10-CM | POA: Insufficient documentation

## 2020-04-19 DIAGNOSIS — Z20822 Contact with and (suspected) exposure to covid-19: Secondary | ICD-10-CM | POA: Diagnosis not present

## 2020-04-20 LAB — SARS CORONAVIRUS 2 (TAT 6-24 HRS): SARS Coronavirus 2: NEGATIVE

## 2020-04-21 ENCOUNTER — Encounter: Payer: Self-pay | Admitting: Vascular Surgery

## 2020-04-21 ENCOUNTER — Ambulatory Visit
Admission: RE | Admit: 2020-04-21 | Discharge: 2020-04-21 | Disposition: A | Discharge status: Home / Self Care | Payer: Medicare Other | Attending: Vascular Surgery | Admitting: Vascular Surgery

## 2020-04-21 ENCOUNTER — Encounter
Admission: RE | Disposition: A | Discharge status: Home / Self Care | Payer: Self-pay | Source: Home / Self Care | Attending: Vascular Surgery

## 2020-04-21 ENCOUNTER — Other Ambulatory Visit: Payer: Self-pay

## 2020-04-21 ENCOUNTER — Other Ambulatory Visit (INDEPENDENT_AMBULATORY_CARE_PROVIDER_SITE_OTHER): Payer: Self-pay | Admitting: Vascular Surgery

## 2020-04-21 DIAGNOSIS — Z794 Long term (current) use of insulin: Secondary | ICD-10-CM | POA: Diagnosis not present

## 2020-04-21 DIAGNOSIS — T82858A Stenosis of vascular prosthetic devices, implants and grafts, initial encounter: Secondary | ICD-10-CM | POA: Diagnosis present

## 2020-04-21 DIAGNOSIS — Y841 Kidney dialysis as the cause of abnormal reaction of the patient, or of later complication, without mention of misadventure at the time of the procedure: Secondary | ICD-10-CM | POA: Insufficient documentation

## 2020-04-21 DIAGNOSIS — Z7982 Long term (current) use of aspirin: Secondary | ICD-10-CM | POA: Diagnosis not present

## 2020-04-21 DIAGNOSIS — F17210 Nicotine dependence, cigarettes, uncomplicated: Secondary | ICD-10-CM | POA: Diagnosis not present

## 2020-04-21 DIAGNOSIS — E1122 Type 2 diabetes mellitus with diabetic chronic kidney disease: Secondary | ICD-10-CM | POA: Diagnosis not present

## 2020-04-21 DIAGNOSIS — N186 End stage renal disease: Secondary | ICD-10-CM

## 2020-04-21 DIAGNOSIS — Z79899 Other long term (current) drug therapy: Secondary | ICD-10-CM | POA: Diagnosis not present

## 2020-04-21 DIAGNOSIS — T82898A Other specified complication of vascular prosthetic devices, implants and grafts, initial encounter: Secondary | ICD-10-CM

## 2020-04-21 DIAGNOSIS — Z992 Dependence on renal dialysis: Secondary | ICD-10-CM | POA: Insufficient documentation

## 2020-04-21 DIAGNOSIS — I120 Hypertensive chronic kidney disease with stage 5 chronic kidney disease or end stage renal disease: Secondary | ICD-10-CM | POA: Insufficient documentation

## 2020-04-21 HISTORY — PX: A/V FISTULAGRAM: CATH118298

## 2020-04-21 LAB — POTASSIUM (ARMC VASCULAR LAB ONLY): Potassium (ARMC vascular lab): 5.5 — ABNORMAL HIGH (ref 3.5–5.1)

## 2020-04-21 SURGERY — A/V FISTULAGRAM
Anesthesia: Moderate Sedation | Laterality: Right

## 2020-04-21 MED ORDER — ONDANSETRON HCL 4 MG/2ML IJ SOLN: 4.0000 mg | Freq: Four times a day (QID) | INTRAMUSCULAR | Status: DC | PRN

## 2020-04-21 MED ORDER — HYDRALAZINE HCL 50 MG PO TABS
100.0000 mg | ORAL_TABLET | Freq: Three times a day (TID) | ORAL | Status: DC
  Administered 2020-04-21: 100 mg via ORAL
  Filled 2020-04-21: qty 2

## 2020-04-21 MED ORDER — MIDAZOLAM HCL 2 MG/ML PO SYRP: 8.0000 mg | ORAL_SOLUTION | Freq: Once | ORAL | Status: DC | PRN

## 2020-04-21 MED ORDER — HEPARIN SODIUM (PORCINE) 1000 UNIT/ML IJ SOLN
INTRAMUSCULAR | Status: AC
  Filled 2020-04-21: qty 1

## 2020-04-21 MED ORDER — CARVEDILOL 25 MG PO TABS
25.0000 mg | ORAL_TABLET | Freq: Two times a day (BID) | ORAL | Status: DC
  Administered 2020-04-21: 25 mg via ORAL
  Filled 2020-04-21: qty 1

## 2020-04-21 MED ORDER — CEFAZOLIN SODIUM-DEXTROSE 1-4 GM/50ML-% IV SOLN
INTRAVENOUS | Status: AC
  Administered 2020-04-21: 1 g via INTRAVENOUS
  Filled 2020-04-21: qty 50

## 2020-04-21 MED ORDER — HYDROMORPHONE HCL 1 MG/ML IJ SOLN: 1.0000 mg | Freq: Once | INTRAMUSCULAR | Status: DC | PRN

## 2020-04-21 MED ORDER — METHYLPREDNISOLONE SODIUM SUCC 125 MG IJ SOLR: 125.0000 mg | Freq: Once | INTRAMUSCULAR | Status: DC | PRN

## 2020-04-21 MED ORDER — SODIUM CHLORIDE 0.9 % IV SOLN: INTRAVENOUS | Status: DC

## 2020-04-21 MED ORDER — CLONIDINE HCL 0.1 MG PO TABS
0.2000 mg | ORAL_TABLET | Freq: Two times a day (BID) | ORAL | Status: DC
  Administered 2020-04-21: 0.2 mg via ORAL
  Filled 2020-04-21: qty 2

## 2020-04-21 MED ORDER — IODIXANOL 320 MG/ML IV SOLN
INTRAVENOUS | Status: DC | PRN
  Administered 2020-04-21: 40 mL via INTRA_ARTERIAL

## 2020-04-21 MED ORDER — AMLODIPINE BESYLATE 5 MG PO TABS
10.0000 mg | ORAL_TABLET | Freq: Every day | ORAL | Status: DC
  Administered 2020-04-21: 10 mg via ORAL
  Filled 2020-04-21: qty 2

## 2020-04-21 MED ORDER — FENTANYL CITRATE (PF) 100 MCG/2ML IJ SOLN
INTRAMUSCULAR | Status: DC | PRN
  Administered 2020-04-21: 50 ug via INTRAVENOUS
  Administered 2020-04-21: 25 ug via INTRAVENOUS

## 2020-04-21 MED ORDER — LOSARTAN POTASSIUM 50 MG PO TABS
100.0000 mg | ORAL_TABLET | Freq: Every day | ORAL | Status: DC
  Administered 2020-04-21: 100 mg via ORAL
  Filled 2020-04-21: qty 2

## 2020-04-21 MED ORDER — MIDAZOLAM HCL 2 MG/2ML IJ SOLN
INTRAMUSCULAR | Status: DC | PRN
  Administered 2020-04-21: 2 mg via INTRAVENOUS
  Administered 2020-04-21: 1 mg via INTRAVENOUS

## 2020-04-21 MED ORDER — HEPARIN SODIUM (PORCINE) 1000 UNIT/ML IJ SOLN
INTRAMUSCULAR | Status: DC | PRN
  Administered 2020-04-21: 3000 [IU] via INTRAVENOUS

## 2020-04-21 MED ORDER — CEFAZOLIN SODIUM-DEXTROSE 1-4 GM/50ML-% IV SOLN: 1.0000 g | INTRAVENOUS | Status: AC

## 2020-04-21 MED ORDER — MIDAZOLAM HCL 5 MG/5ML IJ SOLN
INTRAMUSCULAR | Status: AC
  Filled 2020-04-21: qty 5

## 2020-04-21 MED ORDER — DIPHENHYDRAMINE HCL 50 MG/ML IJ SOLN: 50.0000 mg | Freq: Once | INTRAMUSCULAR | Status: DC | PRN

## 2020-04-21 MED ORDER — ISOSORBIDE MONONITRATE ER 30 MG PO TB24
30.0000 mg | ORAL_TABLET | Freq: Every day | ORAL | Status: DC
  Administered 2020-04-21: 30 mg via ORAL
  Filled 2020-04-21: qty 1

## 2020-04-21 MED ORDER — FENTANYL CITRATE (PF) 100 MCG/2ML IJ SOLN
INTRAMUSCULAR | Status: AC
  Filled 2020-04-21: qty 2

## 2020-04-21 MED ORDER — FAMOTIDINE 20 MG PO TABS: 40.0000 mg | ORAL_TABLET | Freq: Once | ORAL | Status: DC | PRN

## 2020-04-21 SURGICAL SUPPLY — 12 items
BALLN LUTONIX DCB 4X60X130 (BALLOONS) ×2
BALLOON LUTONIX DCB 4X60X130 (BALLOONS) ×1 IMPLANT
CANNULA 5F STIFF (CANNULA) ×2 IMPLANT
CATH BEACON 5 .035 40 KMP TP (CATHETERS) ×1 IMPLANT
CATH BEACON 5 .038 40 KMP TP (CATHETERS) ×1
COVER PROBE U/S 5X48 (MISCELLANEOUS) ×4 IMPLANT
DRAPE BRACHIAL (DRAPES) ×2 IMPLANT
GLIDEWIRE ADV .035X180CM (WIRE) ×2 IMPLANT
KIT ENCORE 26 ADVANTAGE (KITS) ×2 IMPLANT
PACK ANGIOGRAPHY (CUSTOM PROCEDURE TRAY) ×2 IMPLANT
SHEATH BRITE TIP 6FRX5.5 (SHEATH) ×2 IMPLANT
SUT MNCRL AB 4-0 PS2 18 (SUTURE) ×2 IMPLANT

## 2020-04-21 NOTE — Op Note (Signed)
VEIN AND VASCULAR SURGERY    OPERATIVE NOTE   PROCEDURE: 1.   Right radiocephalic arteriovenous fistula cannulation under ultrasound guidance 2.   Right arm fistulagram  3.   Percutaneous transluminal angioplasty of perianastomotic cephalic vein stenosis with 4 mm diameter Lutonix drug-coated angioplasty balloon  PRE-OPERATIVE DIAGNOSIS: 1. ESRD 2. 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  The remainder of the forearm cephalic vein was patent and several branches were seen.  There appeared to be outflow at the antecubital fossa mostly through the deep venous system but some through the basilic vein as well.  SPECIMEN(S):  None  CONTRAST: 40 cc  FLUORO TIME: 5.4 minutes  MODERATE CONSCIOUS SEDATION TIME: Approximately 40 minutes with 3 mg of Versed and 75 mcg of Fentanyl   INDICATIONS: Joyce West is a 30 y.o. female who presents with malfunctioning right radiocephalic arteriovenous fistula.  The patient is scheduled for right arm fistulagram.  The patient is aware the risks include but are not limited to: bleeding, infection, thrombosis of the cannulated access, and possible anaphylactic reaction to the contrast.  The patient is aware of the risks of the procedure and elects to proceed forward.  DESCRIPTION: After full informed written consent was obtained, the patient was brought back to the angiography suite and placed supine upon the angiography table.  The patient was connected to monitoring equipment. Moderate conscious sedation was administered with a face to face encounter with the patient throughout the procedure with my supervision of the RN administering medicines and monitoring the patient's vital  signs and mental status throughout from the start of the procedure until the patient was taken to the recovery room. The right arm was prepped and draped in the standard fashion for a percutaneous access intervention.  Under ultrasound guidance, the right radiocephalic arteriovenous fistula was cannulated with a micropuncture needle under direct ultrasound guidance in a retrograde fashion in the proximal forearm where it was patent and a permanent image was performed.  The microwire was advanced into the fistula and the needle was exchanged for the a microsheath.  I then upsized to a 6 Fr Sheath and imaging was performed.  Hand injections were completed to image the access including the central venous system. 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  The remainder of the forearm cephalic vein was patent and several branches were seen.  There appeared to be outflow at the antecubital fossa mostly through the deep venous system but some through the basilic vein as well.  Based on the images, this patient will need intervention to this perianastomotic cephalic vein stenosis to improve the function of the fistula. I then gave the patient 3000 units of intravenous heparin.  I then crossed the stenosis with a Kumpe catheter and an advantage wire.  Based on the imaging, a 4 mm x 6 cm Lutonix drug-coated angioplasty balloon was selected.  The balloon was centered around the perianastomotic cephalic vein stenosis and inflated to 12 ATM for 1 minute(s).  On completion imaging, a 25% residual stenosis was present.     Based on the completion imaging, no further intervention is necessary.  The wire and balloon were removed from the sheath.  A 4-0 Monocryl  purse-string suture was sewn around the sheath.  The sheath was removed while tying down the suture.  A sterile bandage was applied to the puncture site.  COMPLICATIONS: None  CONDITION: Stable   Leotis Pain  04/21/2020 1:34 PM   This note was created with Dragon Medical transcription system. Any errors in dictation are purely unintentional.

## 2020-04-21 NOTE — Interval H&P Note (Signed)
History and Physical Interval Note:  04/21/2020 11:39 AM  Joyce West  has presented today for surgery, with the diagnosis of RT Arm Fistulagram   ESRD   Pt to have Covid test on 4-12.  The various methods of treatment have been discussed with the patient and family. After consideration of risks, benefits and other options for treatment, the patient has consented to  Procedure(s): A/V FISTULAGRAM (Right) as a surgical intervention.  The patient's history has been reviewed, patient examined, no change in status, stable for surgery.  I have reviewed the patient's chart and labs.  Questions were answered to the patient's satisfaction.     Leotis Pain

## 2020-04-21 NOTE — Progress Notes (Signed)
Patient with fistula to right wrist. Fistula positive for thrill and bruit.

## 2020-04-22 ENCOUNTER — Encounter: Payer: Self-pay | Admitting: Vascular Surgery

## 2020-05-09 ENCOUNTER — Telehealth (INDEPENDENT_AMBULATORY_CARE_PROVIDER_SITE_OTHER): Payer: Self-pay

## 2020-05-09 NOTE — Telephone Encounter (Signed)
A fax was received from [place] at [facility] for the patient to have a permcath removal. Patient is scheduled with Dr. Lucky Cowboy for a permcath removal on 05/19/20 with a 11:15 am arrival time to the MM. Covid testing on 05/17/20 between 8-2 pm at the [facility]. Pre-procedure instructions will be faxed to attention Celeste at [facility]

## 2020-05-11 ENCOUNTER — Other Ambulatory Visit (INDEPENDENT_AMBULATORY_CARE_PROVIDER_SITE_OTHER): Payer: Self-pay | Admitting: Vascular Surgery

## 2020-05-11 DIAGNOSIS — N186 End stage renal disease: Secondary | ICD-10-CM

## 2020-05-12 ENCOUNTER — Encounter (INDEPENDENT_AMBULATORY_CARE_PROVIDER_SITE_OTHER): Payer: Self-pay

## 2020-05-12 ENCOUNTER — Encounter (INDEPENDENT_AMBULATORY_CARE_PROVIDER_SITE_OTHER): Payer: Self-pay | Admitting: Nurse Practitioner

## 2020-05-12 ENCOUNTER — Encounter (INDEPENDENT_AMBULATORY_CARE_PROVIDER_SITE_OTHER): Payer: Medicare Other

## 2020-05-12 ENCOUNTER — Ambulatory Visit (INDEPENDENT_AMBULATORY_CARE_PROVIDER_SITE_OTHER): Payer: Medicare Other | Admitting: Nurse Practitioner

## 2020-05-17 ENCOUNTER — Other Ambulatory Visit: Payer: Medicare Other

## 2020-05-19 ENCOUNTER — Encounter
Admission: RE | Disposition: A | Discharge status: Home / Self Care | Payer: Self-pay | Source: Home / Self Care | Attending: Vascular Surgery

## 2020-05-19 ENCOUNTER — Encounter: Payer: Self-pay | Admitting: Vascular Surgery

## 2020-05-19 ENCOUNTER — Ambulatory Visit
Admission: RE | Admit: 2020-05-19 | Discharge: 2020-05-19 | Disposition: A | Discharge status: Home / Self Care | Payer: Medicare Other | Attending: Vascular Surgery | Admitting: Vascular Surgery

## 2020-05-19 ENCOUNTER — Other Ambulatory Visit (INDEPENDENT_AMBULATORY_CARE_PROVIDER_SITE_OTHER): Payer: Self-pay | Admitting: Nurse Practitioner

## 2020-05-19 DIAGNOSIS — N185 Chronic kidney disease, stage 5: Secondary | ICD-10-CM | POA: Diagnosis not present

## 2020-05-19 DIAGNOSIS — Z4901 Encounter for fitting and adjustment of extracorporeal dialysis catheter: Secondary | ICD-10-CM | POA: Insufficient documentation

## 2020-05-19 DIAGNOSIS — F17210 Nicotine dependence, cigarettes, uncomplicated: Secondary | ICD-10-CM | POA: Insufficient documentation

## 2020-05-19 DIAGNOSIS — I120 Hypertensive chronic kidney disease with stage 5 chronic kidney disease or end stage renal disease: Secondary | ICD-10-CM | POA: Insufficient documentation

## 2020-05-19 DIAGNOSIS — N186 End stage renal disease: Secondary | ICD-10-CM | POA: Diagnosis not present

## 2020-05-19 DIAGNOSIS — Z888 Allergy status to other drugs, medicaments and biological substances status: Secondary | ICD-10-CM | POA: Diagnosis not present

## 2020-05-19 DIAGNOSIS — E1122 Type 2 diabetes mellitus with diabetic chronic kidney disease: Secondary | ICD-10-CM | POA: Diagnosis not present

## 2020-05-19 HISTORY — PX: DIALYSIS/PERMA CATHETER REMOVAL: CATH118289

## 2020-05-19 SURGERY — DIALYSIS/PERMA CATHETER REMOVAL
Anesthesia: LOCAL

## 2020-05-19 MED ORDER — LIDOCAINE-EPINEPHRINE (PF) 1 %-1:200000 IJ SOLN
INTRAMUSCULAR | Status: DC | PRN
  Administered 2020-05-19: 10 mL via INTRADERMAL

## 2020-05-19 SURGICAL SUPPLY — 3 items
CHLORAPREP W/TINT 26 (MISCELLANEOUS) ×4 IMPLANT
FORCEPS HALSTEAD CVD 5IN STRL (INSTRUMENTS) ×2 IMPLANT
TRAY LACERAT/PLASTIC (MISCELLANEOUS) ×2 IMPLANT

## 2020-05-19 NOTE — H&P (Signed)
[facility] Admission History & Physical  MRN : [id number]  Joyce West is a 30 y.o. (1990/01/29) female who presents with chief complaint of scheduled PermCath removal.  History of Present Illness:  I am asked to evaluate the patient by her dialysis center. The patient was sent here because they have a functioning right radiocephalic AV fistula. The patient no longer needs her PermCath and has presented for removal. The patient reports they're not been any problems with any of their dialysis runs. They are reporting good flows with good parameters at dialysis. Patient denies pain or tenderness overlying the access.  There is no pain with dialysis.  The patient denies hand pain or finger pain consistent with steal syndrome.  No fevers or chills while on dialysis.  No current facility-administered medications for this encounter.   Past Medical History:  Diagnosis Date  . Anxiety   . History of 2019 novel coronavirus disease (COVID-19) 01/17/2020  . Hypertension   . Ischemic stroke ([place])    X3 2021  . Pneumonia   . Renal disorder    ESRD, dialysis since may 2021  . Right sided weakness    secondary to stroke    Past Surgical History:  Procedure Laterality Date  . A/V FISTULAGRAM Right 04/21/2020   Procedure: A/V FISTULAGRAM;  Surgeon: Algernon Huxley, MD;  Location: [facility];  Service: Cardiovascular;  Laterality: Right;  . AV FISTULA PLACEMENT Right 02/04/2020   Procedure: ARTERIOVENOUS (AV) FISTULA CREATION;  Surgeon: Algernon Huxley, MD;  Location: [facility];  Service: Vascular;  Laterality: Right;  . CESAREAN SECTION     x 3  . DIALYSIS/PERMA CATHETER INSERTION N/A 05/29/2019   Procedure: DIALYSIS/PERMA CATHETER INSERTION;  Surgeon: Algernon Huxley, MD;  Location: [place] [facility];  Service: Cardiovascular;  Laterality: N/A;  . DIALYSIS/PERMA CATHETER INSERTION N/A 11/02/2019   Procedure: DIALYSIS/PERMA CATHETER INSERTION;  Surgeon: Algernon Huxley,  MD;  Location: [facility];  Service: Cardiovascular;  Laterality: N/A;  . TUBAL LIGATION     Social History Social History   Tobacco Use  . Smoking status: Current Every Day Smoker    Types: Cigarettes  . Smokeless tobacco: Never Used  Vaping Use  . Vaping Use: Never used  Substance Use Topics  . Alcohol use: Not Currently  . Drug use: Never   Family History No family history of bleeding or clotting disorders, autoimmune disease or porphyria.  Allergies  Allergen Reactions  . Enalapril     cough  . Lisinopril Cough   REVIEW OF SYSTEMS (Negative unless checked)  Constitutional: '[]'$ Weight loss  '[]'$ Fever  '[]'$ Chills Cardiac: '[]'$ Chest pain   '[]'$ Chest pressure   '[]'$ Palpitations   '[]'$ Shortness of breath when laying flat   '[]'$ Shortness of breath at rest   '[x]'$ Shortness of breath with exertion. Vascular:  '[]'$ Pain in legs with walking   '[]'$ Pain in legs at rest   '[]'$ Pain in legs when laying flat   '[]'$ Claudication   '[]'$ Pain in feet when walking  '[]'$ Pain in feet at rest  '[]'$ Pain in feet when laying flat   '[]'$ History of DVT   '[]'$ Phlebitis   '[x]'$ Swelling in legs   '[]'$ Varicose veins   '[]'$ Non-healing ulcers Pulmonary:   '[]'$ Uses home oxygen   '[]'$ Productive cough   '[]'$ Hemoptysis   '[]'$ Wheeze  '[]'$ COPD   '[]'$ Asthma Neurologic:  '[]'$ Dizziness  '[]'$ Blackouts   '[]'$ Seizures   '[]'$ History of stroke   '[]'$ History of TIA  '[]'$ Aphasia   '[]'$ Temporary blindness   '[]'$   Dysphagia   '[]'$ Weakness or numbness in arms   '[]'$ Weakness or numbness in legs Musculoskeletal:  '[]'$ Arthritis   '[]'$ Joint swelling   '[]'$ Joint pain   '[]'$ Low back pain Hematologic:  '[]'$ Easy bruising  '[]'$ Easy bleeding   '[]'$ Hypercoagulable state   '[]'$ Anemic  '[]'$ Hepatitis Gastrointestinal:  '[]'$ Blood in stool   '[]'$ Vomiting blood  '[]'$ Gastroesophageal reflux/heartburn   '[]'$ Difficulty swallowing. Genitourinary:  '[x]'$ Chronic kidney disease   '[]'$ Difficult urination  '[]'$ Frequent urination  '[]'$ Burning with urination   '[]'$ Blood in urine Skin:  '[]'$ Rashes   '[]'$ Ulcers   '[]'$ Wounds Psychological:  '[]'$ History of anxiety   '[]'$   History of major depression.  Physical Examination  Vitals:   05/19/20 1230 05/19/20 1258  BP: (!) 144/90 (!) 148/94  Pulse: 87 87  Resp: 14 12  Temp: 97.8 F (36.6 C)   TempSrc: Oral   SpO2: 98% 97%  Weight: 65.8 kg   Height: '5\' 4"'$  (1.626 m)    Body mass index is 24.89 kg/m. Gen: WD/WN, NAD Head: [place]/AT, No temporalis wasting. Prominent temp pulse not noted. Ear/Nose/Throat: Hearing grossly intact, nares w/o erythema or drainage, oropharynx w/o Erythema/Exudate,  Eyes: Conjunctiva clear, sclera non-icteric Neck: Trachea midline.  No JVD.  Pulmonary:  Good air movement, respirations not labored, no use of accessory muscles.  Cardiac: RRR, normal S1, S2. Vascular:  Vessel Right Left  Radial Palpable Palpable  Ulnar Not Palpable Not Palpable  Brachial Palpable Palpable  Carotid Palpable, without bruit Palpable, without bruit  Gastrointestinal: soft, non-tender/non-distended. No guarding/reflex.  Musculoskeletal: M/S 5/5 throughout.  Extremities without ischemic changes.  No deformity or atrophy.  Neurologic: Sensation grossly intact in extremities.  Symmetrical.  Speech is fluent. Motor exam as listed above. Psychiatric: Judgment intact, Mood & affect appropriate for pt's clinical situation. Dermatologic: No rashes or ulcers noted.  No cellulitis or open wounds. Lymph : No Cervical, Axillary, or Inguinal lymphadenopathy.  CBC Lab Results  Component Value Date   WBC 6.6 02/25/2020   HGB 7.7 (L) 02/25/2020   HCT 24.0 (L) 02/25/2020   MCV 90.9 02/25/2020   PLT 274 02/25/2020   BMET    Component Value Date/Time   NA 139 02/25/2020 0049   NA 136 04/13/2014 2026   K 6.4 (HH) 02/25/2020 0049   K 3.7 04/13/2014 2026   CL 107 02/25/2020 0049   CL 103 04/13/2014 2026   CO2 15 (L) 02/25/2020 0049   CO2 26 04/13/2014 2026   GLUCOSE 119 (H) 02/25/2020 0049   GLUCOSE 241 (H) 04/13/2014 2026   BUN 100 (H) 02/25/2020 0049   BUN 15 04/13/2014 2026   CREATININE 17.87 (H)  02/25/2020 0049   CREATININE 0.80 04/13/2014 2026   CALCIUM 9.8 02/25/2020 0049   CALCIUM 9.1 04/13/2014 2026   GFRNONAA 2 (L) 02/25/2020 0049   GFRNONAA >60 04/13/2014 2026   GFRAA 12 (L) 04/28/2019 1435   GFRAA >60 04/13/2014 2026   CrCl cannot be calculated (Patient's most recent lab result is older than the maximum 21 days allowed.).  COAG Lab Results  Component Value Date   INR 1.1 02/03/2020   INR 1.1 12/25/2019   Radiology PERIPHERAL VASCULAR CATHETERIZATION  Result Date: 04/21/2020 See op note  Assessment/Plan 1.  Complication dialysis device with thrombosis AV access:  The patient has a right upper extremity access that is functioning well. Therefore, the patient will undergo removal of the tunneled catheter under local anesthesia. The risks and benefits were described to the patient. All questions were answered. The patient agrees to proceed.  2.  End-stage renal disease requiring hemodialysis:   Patient will continue dialysis therapy without further interruption if a successful intervention is not achieved then a tunneled catheter will be placed. Dialysis has already been arranged.  3.  Hypertension:   Patient will continue medical management; nephrology is following no changes in oral medications.  4. Diabetes mellitus:   Glucose will be monitored and oral medications been held this morning once the patient has undergone the patient's procedure po intake will be reinitiated and again Accu-Cheks will be used to assess the blood glucose level and treat as needed. The patient will be restarted on the patient's usual hypoglycemic regime  Discussed with Dr. Mayme Genta, PA-C  05/19/2020 1:40 PM

## 2020-05-19 NOTE — Op Note (Signed)
Operative Note  Preoperative diagnosis:    1. ESRD with functional permanent access  Postoperative diagnosis:   1. ESRD with functional permanent access  Procedure:  Removal of Left Permcath  Physician Assistant: Hezzie Bump PA-C  Surgeon:  Leotis Pain, MD  Anesthesia:  Local  EBL:  Minimal  Indication for the Procedure:  The patient has a functional permanent dialysis access and no longer needs their permcath.  This can be removed.  Risks and benefits are discussed and informed consent is obtained.  Description of the Procedure:  The patient's left neck, chest and existing catheter were sterilely prepped and draped. The area around the catheter was anesthetized copiously with 1% lidocaine. The catheter was dissected out with curved hemostats until the cuff was freed from the surrounding fibrous sheath. The fiber sheath was transected, and the catheter was then removed in its entirety using gentle traction. Pressure was held and sterile dressings were placed. The patient tolerated the procedure well and was taken to the recovery room in stable condition.  [place] 05/19/2020, 1:46 PM  This note was created with Dragon Medical transcription system. Any errors in dictation are purely unintentional.

## 2020-05-19 NOTE — Discharge Instructions (Signed)
Tunneled Catheter Removal, Care After Refer to this sheet in the next few weeks. These instructions provide you with information about caring for yourself after your procedure. Your health care provider may also give you more specific instructions. Your treatment has been planned according to current medical practices, but problems sometimes occur. Call your health care provider if you have any problems or questions after your procedure. What can I expect after the procedure? After the procedure, it is common to have: Some mild redness, swelling, and pain around your catheter site.   Follow these instructions at home: Incision care  Check your removal site  every day for signs of infection. Check for: More redness, swelling, or pain. More fluid or blood. Warmth. Pus or a bad smell. Remove your dressing in 48hrs leave open to air  Activity  Return to your normal activities as told by your health care provider. Ask your health care provider what activities are safe for you. Do not lift anything that is heavier than 10 lb (4.5 kg) for 3 days  You may shower tomorrow  Contact a health care provider if: You have more fluid or blood coming from your removal site You have more redness, swelling, or pain at your incisions or around the area where your catheter was removed Your removal site feel warm to the touch. You feel unusually weak. You feel nauseous.. Get help right away if You have swelling in your arm, shoulder, neck, or face. You develop chest pain. You have difficulty breathing. You feel dizzy or light-headed. You have pus or a bad smell coming from your removal site You have a fever. You develop bleeding from your removal site, and your bleeding does not stop. This information is not intended to replace advice given to you by your health care provider. Make sure you discuss any questions you have with your health care provider. Document Released: 12/12/2011 Document Revised:  08/28/2015 Document Reviewed: 09/20/2014 Elsevier Interactive Patient Education  2017 [employer] 

## 2020-09-28 ENCOUNTER — Encounter: Payer: Self-pay | Admitting: *Deleted

## 2020-09-29 ENCOUNTER — Ambulatory Visit: Admission: RE | Admit: 2020-09-29 | Payer: Medicare Other | Source: Home / Self Care

## 2020-09-29 ENCOUNTER — Encounter: Admission: RE | Payer: Self-pay | Source: Home / Self Care

## 2020-09-29 SURGERY — ESOPHAGOGASTRODUODENOSCOPY (EGD) WITH PROPOFOL
Anesthesia: General

## 2020-10-06 ENCOUNTER — Ambulatory Visit
Admission: RE | Admit: 2020-10-06 | Discharge: 2020-10-06 | Disposition: A | Discharge status: Home / Self Care | Payer: Medicare Other | Attending: Gastroenterology | Admitting: Gastroenterology

## 2020-10-06 ENCOUNTER — Ambulatory Visit: Payer: Medicare Other | Admitting: Anesthesiology

## 2020-10-06 ENCOUNTER — Encounter: Payer: Self-pay | Admitting: *Deleted

## 2020-10-06 ENCOUNTER — Encounter
Admission: RE | Disposition: A | Discharge status: Home / Self Care | Payer: Self-pay | Source: Home / Self Care | Attending: Gastroenterology

## 2020-10-06 DIAGNOSIS — Z7984 Long term (current) use of oral hypoglycemic drugs: Secondary | ICD-10-CM | POA: Insufficient documentation

## 2020-10-06 DIAGNOSIS — K640 First degree hemorrhoids: Secondary | ICD-10-CM | POA: Diagnosis not present

## 2020-10-06 DIAGNOSIS — K2950 Unspecified chronic gastritis without bleeding: Secondary | ICD-10-CM | POA: Insufficient documentation

## 2020-10-06 DIAGNOSIS — K635 Polyp of colon: Secondary | ICD-10-CM | POA: Diagnosis not present

## 2020-10-06 DIAGNOSIS — K5900 Constipation, unspecified: Secondary | ICD-10-CM | POA: Diagnosis not present

## 2020-10-06 DIAGNOSIS — Z888 Allergy status to other drugs, medicaments and biological substances status: Secondary | ICD-10-CM | POA: Diagnosis not present

## 2020-10-06 DIAGNOSIS — K5730 Diverticulosis of large intestine without perforation or abscess without bleeding: Secondary | ICD-10-CM | POA: Insufficient documentation

## 2020-10-06 DIAGNOSIS — Z8379 Family history of other diseases of the digestive system: Secondary | ICD-10-CM | POA: Diagnosis not present

## 2020-10-06 DIAGNOSIS — K2100 Gastro-esophageal reflux disease with esophagitis, without bleeding: Secondary | ICD-10-CM | POA: Diagnosis not present

## 2020-10-06 DIAGNOSIS — Z8616 Personal history of COVID-19: Secondary | ICD-10-CM | POA: Insufficient documentation

## 2020-10-06 DIAGNOSIS — Z8673 Personal history of transient ischemic attack (TIA), and cerebral infarction without residual deficits: Secondary | ICD-10-CM | POA: Diagnosis not present

## 2020-10-06 DIAGNOSIS — K529 Noninfective gastroenteritis and colitis, unspecified: Secondary | ICD-10-CM | POA: Insufficient documentation

## 2020-10-06 DIAGNOSIS — R6881 Early satiety: Secondary | ICD-10-CM | POA: Diagnosis not present

## 2020-10-06 DIAGNOSIS — Z992 Dependence on renal dialysis: Secondary | ICD-10-CM | POA: Diagnosis not present

## 2020-10-06 DIAGNOSIS — Z7982 Long term (current) use of aspirin: Secondary | ICD-10-CM | POA: Insufficient documentation

## 2020-10-06 DIAGNOSIS — Z79899 Other long term (current) drug therapy: Secondary | ICD-10-CM | POA: Insufficient documentation

## 2020-10-06 DIAGNOSIS — Z800 Family history of malignant neoplasm of digestive organs: Secondary | ICD-10-CM | POA: Diagnosis not present

## 2020-10-06 DIAGNOSIS — R112 Nausea with vomiting, unspecified: Secondary | ICD-10-CM | POA: Diagnosis not present

## 2020-10-06 HISTORY — PX: ESOPHAGOGASTRODUODENOSCOPY: SHX5428

## 2020-10-06 HISTORY — PX: COLONOSCOPY: SHX5424

## 2020-10-06 LAB — POCT I-STAT, CHEM 8
BUN: 23 mg/dL — ABNORMAL HIGH (ref 6–20)
Calcium, Ion: 1.18 mmol/L (ref 1.15–1.40)
Chloride: 102 mmol/L (ref 98–111)
Creatinine, Ser: 7.6 mg/dL — ABNORMAL HIGH (ref 0.44–1.00)
Glucose, Bld: 114 mg/dL — ABNORMAL HIGH (ref 70–99)
HCT: 32 % — ABNORMAL LOW (ref 36.0–46.0)
Hemoglobin: 10.9 g/dL — ABNORMAL LOW (ref 12.0–15.0)
Potassium: 4.5 mmol/L (ref 3.5–5.1)
Sodium: 140 mmol/L (ref 135–145)
TCO2: 28 mmol/L (ref 22–32)

## 2020-10-06 LAB — GLUCOSE, CAPILLARY: Glucose-Capillary: 104 mg/dL — ABNORMAL HIGH (ref 70–99)

## 2020-10-06 SURGERY — COLONOSCOPY
Anesthesia: General

## 2020-10-06 MED ORDER — PROPOFOL 500 MG/50ML IV EMUL
INTRAVENOUS | Status: DC | PRN
  Administered 2020-10-06: 50 ug/kg/min via INTRAVENOUS

## 2020-10-06 MED ORDER — SODIUM CHLORIDE 0.9 % IV SOLN
INTRAVENOUS | Status: DC
  Administered 2020-10-06: 500 mL via INTRAVENOUS

## 2020-10-06 MED ORDER — FENTANYL CITRATE (PF) 100 MCG/2ML IJ SOLN
INTRAMUSCULAR | Status: DC | PRN
  Administered 2020-10-06 (×4): 25 ug via INTRAVENOUS

## 2020-10-06 MED ORDER — MIDAZOLAM HCL 2 MG/2ML IJ SOLN
INTRAMUSCULAR | Status: AC
  Filled 2020-10-06: qty 2

## 2020-10-06 MED ORDER — LIDOCAINE HCL (CARDIAC) PF 100 MG/5ML IV SOSY
PREFILLED_SYRINGE | INTRAVENOUS | Status: DC | PRN
  Administered 2020-10-06: 50 mg via INTRAVENOUS
  Administered 2020-10-06: 25 mg via INTRAVENOUS

## 2020-10-06 MED ORDER — FENTANYL CITRATE (PF) 100 MCG/2ML IJ SOLN
INTRAMUSCULAR | Status: AC
  Filled 2020-10-06: qty 2

## 2020-10-06 MED ORDER — MIDAZOLAM HCL 2 MG/2ML IJ SOLN
INTRAMUSCULAR | Status: DC | PRN
  Administered 2020-10-06: 2 mg via INTRAVENOUS

## 2020-10-06 MED ORDER — PROPOFOL 10 MG/ML IV BOLUS
INTRAVENOUS | Status: DC | PRN
  Administered 2020-10-06: 20 mg via INTRAVENOUS
  Administered 2020-10-06: 30 mg via INTRAVENOUS

## 2020-10-06 NOTE — Transfer of Care (Signed)
Immediate Anesthesia Transfer of Care Note  Patient: Joyce West  Procedure(s) Performed: COLONOSCOPY ESOPHAGOGASTRODUODENOSCOPY (EGD)  Patient Location: PACU  Anesthesia Type:General  Level of Consciousness: sedated  Airway & Oxygen Therapy: Patient Spontanous Breathing and Patient connected to face mask oxygen  Post-op Assessment: Report given to RN and Post -op Vital signs reviewed and stable  Post vital signs: Reviewed and stable  Last Vitals:  Vitals Value Taken Time  BP    Temp    Pulse 78 10/06/20 1348  Resp 0 10/06/20 1348  SpO2 96 % 10/06/20 1348  Vitals shown include unvalidated device data.  Last Pain:  Vitals:   10/06/20 1221  TempSrc: Temporal  PainSc: 0-No pain         Complications: No notable events documented.

## 2020-10-06 NOTE — Progress Notes (Signed)
With patient permission prior to procedure, the postoperative findings and results were discussed with her driver/significant other.  He was reached over the phone and after confirming her date of birth these findings were discussed with him.  An opportunity was given for questions and appropriately answers provided.  He verbalized understanding.  Annamaria Helling, DO [facility]

## 2020-10-06 NOTE — Anesthesia Preprocedure Evaluation (Signed)
Anesthesia Evaluation  Patient identified by MRN, date of birth, ID band Patient awake    Reviewed: Allergy & Precautions, NPO status , Patient's Chart, lab work & pertinent test results  History of Anesthesia Complications Negative for: history of anesthetic complications  Airway Mallampati: III  TM Distance: >3 FB Neck ROM: full    Dental  (+) Chipped, Poor Dentition   Pulmonary neg shortness of breath, Current Smoker and Patient abstained from smoking.,    Pulmonary exam normal        Cardiovascular hypertension, (-) anginaNormal cardiovascular exam     Neuro/Psych PSYCHIATRIC DISORDERS CVA (right side), Residual Symptoms    GI/Hepatic negative GI ROS, Neg liver ROS, neg GERD  ,  Endo/Other  diabetes, Type 2  Renal/GU Renal diseasenegative Renal ROS  negative genitourinary   Musculoskeletal   Abdominal   Peds  Hematology negative hematology ROS (+)   Anesthesia Other Findings Past Medical History: No date: Anxiety No date: Diabetes mellitus without complication ([place]) [id number]: History of 2019 novel coronavirus disease (COVID-19) No date: Hypertension No date: Ischemic stroke [facility])     Comment:  X3 2021 No date: Pneumonia No date: Renal disorder     Comment:  ESRD, dialysis since may 2021 No date: Right sided weakness     Comment:  secondary to stroke   Past Surgical History: 04/21/2020: A/V FISTULAGRAM; Right     Comment:  Procedure: A/V FISTULAGRAM;  Surgeon: Algernon Huxley, MD;               Location: [facility];  Service: Cardiovascular;              Laterality: Right; 02/04/2020: AV FISTULA PLACEMENT; Right     Comment:  Procedure: ARTERIOVENOUS (AV) FISTULA CREATION;                Surgeon: Algernon Huxley, MD;  Location: [facility];  Service:              Vascular;  Laterality: Right; No date: CESAREAN SECTION     Comment:  x 3 05/29/2019: DIALYSIS/PERMA CATHETER INSERTION; N/A     Comment:   Procedure: DIALYSIS/PERMA CATHETER INSERTION;  Surgeon:               Algernon Huxley, MD;  Location: [place] [facility];                Service: Cardiovascular;  Laterality: N/A; 11/02/2019: DIALYSIS/PERMA CATHETER INSERTION; N/A     Comment:  Procedure: DIALYSIS/PERMA CATHETER INSERTION;  Surgeon:               Algernon Huxley, MD;  Location: [place] [facility];                Service: Cardiovascular;  Laterality: N/A; 05/19/2020: DIALYSIS/PERMA CATHETER REMOVAL; N/A     Comment:  Procedure: DIALYSIS/PERMA CATHETER REMOVAL;  Surgeon:               Algernon Huxley, MD;  Location: [place] [facility];                Service: Cardiovascular;  Laterality: N/A; No date: TUBAL LIGATION  BMI    Body Mass Index: 23.91 kg/m      Reproductive/Obstetrics negative OB ROS                             Anesthesia Physical Anesthesia Plan  ASA:  3  Anesthesia Plan: General   Post-op Pain Management:    Induction: Intravenous  PONV Risk Score and Plan: Propofol infusion and TIVA  Airway Management Planned: Natural Airway and Nasal Cannula  Additional Equipment:   Intra-op Plan:   Post-operative Plan:   Informed Consent: I have reviewed the patients History and Physical, chart, labs and discussed the procedure including the risks, benefits and alternatives for the proposed anesthesia with the patient or authorized representative who has indicated his/her understanding and acceptance.     Dental Advisory Given  Plan Discussed with: Anesthesiologist, CRNA and Surgeon  Anesthesia Plan Comments: (Patient consented for risks of anesthesia including but not limited to:  - adverse reactions to medications - risk of airway placement if required - damage to eyes, teeth, lips or other oral mucosa - nerve damage due to positioning  - sore throat or hoarseness - Damage to heart, brain, nerves, lungs, other parts of body or loss of life  Patient voiced understanding.)         Anesthesia Quick Evaluation

## 2020-10-06 NOTE — H&P (Signed)
[facility] Pre-Procedure H&P   Patient ID: Joyce West is a 30 y.o. female.  Gastroenterology Provider: Annamaria Helling, DO  Referring Provider: Laurine Blazer, PA PCP: Dianne Dun, MD  Date: 10/06/2020  HPI Joyce West is a 30 y.o. female who presents today for Esophagogastroduodenoscopy and Colonoscopy for chronic diarrhea, nausea, vomiting, early satiety.  Patient has variable diarrhea and constipation ranging from formed bowel movements to loose stools.  She also notes nausea and vomiting which is improved since initiating on hemodialysis.  Pepto and Imodium occasionally help with the loose stools.  She still notes early satiety but weight has been stable.  She still has nocturnal awakenings.  Hemoglobin 11.  Negative CT scan, stool studies.  Mother has history of ulcerative colitis and gastric cancer  No dysphagia odynophagia or reflux.  No other acute GI complaints  Past Medical History:  Diagnosis Date   Anxiety    Diabetes mellitus without complication ([place])    History of 2019 novel coronavirus disease (COVID-19) 01/17/2020   Hypertension    Ischemic stroke ([place])    X3 2021   Pneumonia    Renal disorder    ESRD, dialysis since may 2021   Right sided weakness    secondary to stroke     Past Surgical History:  Procedure Laterality Date   A/V FISTULAGRAM Right 04/21/2020   Procedure: A/V FISTULAGRAM;  Surgeon: Algernon Huxley, MD;  Location: [facility];  Service: Cardiovascular;  Laterality: Right;   AV FISTULA PLACEMENT Right 02/04/2020   Procedure: ARTERIOVENOUS (AV) FISTULA CREATION;  Surgeon: Algernon Huxley, MD;  Location: [facility];  Service: Vascular;  Laterality: Right;   CESAREAN SECTION     x 3   DIALYSIS/PERMA CATHETER INSERTION N/A 05/29/2019   Procedure: DIALYSIS/PERMA CATHETER INSERTION;  Surgeon: Algernon Huxley, MD;  Location: [facility];  Service: Cardiovascular;  Laterality: N/A;   DIALYSIS/PERMA CATHETER  INSERTION N/A 11/02/2019   Procedure: DIALYSIS/PERMA CATHETER INSERTION;  Surgeon: Algernon Huxley, MD;  Location: [facility];  Service: Cardiovascular;  Laterality: N/A;   DIALYSIS/PERMA CATHETER REMOVAL N/A 05/19/2020   Procedure: DIALYSIS/PERMA CATHETER REMOVAL;  Surgeon: Algernon Huxley, MD;  Location: [facility];  Service: Cardiovascular;  Laterality: N/A;   TUBAL LIGATION      Family History Mother- UC; gastric cancer No other h/o GI disease or malignancy  Review of Systems  Constitutional:  Negative for activity change, appetite change, chills, fatigue, fever and unexpected weight change.  HENT:  Negative for trouble swallowing and voice change.   Respiratory:  Negative for shortness of breath and wheezing.   Cardiovascular:  Negative for chest pain and palpitations.  Gastrointestinal:  Positive for abdominal distention, abdominal pain, constipation, diarrhea, nausea and vomiting. Negative for anal bleeding, blood in stool and rectal pain.       Early satiety  Musculoskeletal:  Negative for arthralgias and myalgias.  Skin:  Negative for color change and pallor.  Neurological:  Negative for dizziness, syncope and weakness.  Psychiatric/Behavioral:  Negative for agitation and confusion.   All other systems reviewed and are negative.   Medications No current facility-administered medications on file prior to encounter.   Current Outpatient Medications on File Prior to Encounter  Medication Sig Dispense Refill   moxifloxacin (VIGAMOX) 0.5 % ophthalmic solution 1 drop 3 (three) times daily.     albuterol (VENTOLIN HFA) 108 (90 Base) MCG/ACT inhaler Inhale 2 puffs into the lungs every 4 (four)  hours as needed.     amLODipine (NORVASC) 10 MG tablet Take 10 mg by mouth daily.     aspirin 81 MG chewable tablet Chew by mouth daily.     atorvastatin (LIPITOR) 80 MG tablet Take 80 mg by mouth daily. Patient does not know what does she takes     benzonatate (TESSALON) 100 MG  capsule Take by mouth 3 (three) times daily as needed for cough.     calcitRIOL (ROCALTROL) 0.25 MCG capsule Take 0.25 mcg by mouth daily.     calcium acetate (PHOSLO) 667 MG capsule Take 1,334 mg by mouth 3 (three) times daily.     carvedilol (COREG) 25 MG tablet Take 25 mg by mouth 2 (two) times daily.     cloNIDine (CATAPRES) 0.2 MG tablet Take 0.2 mg by mouth 2 (two) times daily.     furosemide (LASIX) 40 MG tablet Take 40 mg by mouth 3 (three) times daily. PT TAKES ONE TIME A DAY AT NIGHT     gabapentin (NEURONTIN) 300 MG capsule Take 300 mg by mouth daily.     gentamicin cream (GARAMYCIN) 0.1 % Apply 1 application topically 2 (two) times daily. (Patient not taking: Reported on 04/21/2020)     glipiZIDE (GLUCOTROL XL) 10 MG 24 hr tablet Take 10 mg by mouth daily with breakfast.     guaiFENesin-codeine 100-10 MG/5ML syrup Take by mouth. (Patient not taking: Reported on 04/21/2020)     hydrALAZINE (APRESOLINE) 100 MG tablet Take 100 mg by mouth 3 (three) times daily. PT TAKES ONE MORNING AND ONE AT NIGHT     HYDROcodone-acetaminophen (NORCO) 5-325 MG tablet Take 1 tablet by mouth every 6 (six) hours as needed for moderate pain. (Patient not taking: Reported on 04/21/2020) 30 tablet 0   hydrOXYzine (ATARAX/VISTARIL) 25 MG tablet Take 25 mg by mouth every 12 (twelve) hours as needed.     isosorbide mononitrate (IMDUR) 30 MG 24 hr tablet Take 30 mg by mouth daily.     liraglutide (VICTOZA) 18 MG/3ML SOPN Inject 1.8 mg into the skin daily. (Patient not taking: Reported on 04/21/2020)     losartan (COZAAR) 100 MG tablet Take 100 mg by mouth daily.     melatonin 3 MG TABS tablet Take by mouth.     NIFEdipine (PROCARDIA-XL/NIFEDICAL-XL) 30 MG 24 hr tablet Take 30 mg by mouth daily.     polyethylene glycol (MIRALAX / GLYCOLAX) 17 g packet Take by mouth. (Patient not taking: Reported on 04/21/2020)     sodium zirconium cyclosilicate (LOKELMA) 10 g PACK packet Take 10 g by mouth.     traMADol (ULTRAM) 50 MG  tablet Take 1 tablet (50 mg total) by mouth every 6 (six) hours as needed. (Patient not taking: Reported on 04/21/2020) 15 tablet 0    Pertinent medications related to GI and procedure were reviewed by me with the patient prior to the procedure  No current facility-administered medications for this encounter.      Allergies  Allergen Reactions   Enalapril     cough   Lisinopril Cough   Allergies were reviewed by me prior to the procedure  Objective    There were no vitals filed for this visit.   Physical Exam Vitals reviewed.  Constitutional:      General: She is not in acute distress.    Appearance: Normal appearance. She is not ill-appearing, toxic-appearing or diaphoretic.  HENT:     Head: Normocephalic and atraumatic.     Nose: Nose  normal.     Mouth/Throat:     Mouth: Mucous membranes are moist.     Pharynx: Oropharynx is clear.  Eyes:     General: No scleral icterus.    Extraocular Movements: Extraocular movements intact.  Cardiovascular:     Rate and Rhythm: Normal rate and regular rhythm.     Heart sounds: Normal heart sounds. No murmur heard.   No friction rub. No gallop.  Pulmonary:     Effort: Pulmonary effort is normal. No respiratory distress.     Breath sounds: Normal breath sounds. No wheezing, rhonchi or rales.  Abdominal:     General: Abdomen is flat. Bowel sounds are normal. There is no distension.     Palpations: Abdomen is soft.     Tenderness: There is no abdominal tenderness. There is no guarding or rebound.  Musculoskeletal:     Cervical back: Neck supple.     Right lower leg: No edema.     Left lower leg: No edema.     Comments: LUE fistula  Skin:    General: Skin is warm and dry.     Coloration: Skin is not jaundiced or pale.  Neurological:     General: No focal deficit present.     Mental Status: She is alert and oriented to person, place, and time. Mental status is at baseline.  Psychiatric:        Mood and Affect: Mood normal.         Behavior: Behavior normal.        Thought Content: Thought content normal.        Judgment: Judgment normal.     Assessment:  Joyce West is a 30 y.o. female  who presents today for Esophagogastroduodenoscopy and Colonoscopy for chronic diarrhea, nausea, vomiting, early satiety.  Plan:  Esophagogastroduodenoscopy and Colonoscopy with possible intervention today  Esophagogastroduodenoscopy and colonoscopy with possible biopsy, control of bleeding, polypectomy, and interventions as necessary has been discussed with the patient/patient representative. Informed consent was obtained from the patient/patient representative after explaining the indication, nature, and risks of the procedure including but not limited to death, bleeding, perforation, missed neoplasm/lesions, cardiorespiratory compromise, and reaction to medications. Opportunity for questions was given and appropriate answers were provided. Patient/patient representative has verbalized understanding is amenable to undergoing the procedure.   Annamaria Helling, DO  [facility]  Portions of the record may have been created with voice recognition software. Occasional wrong-word or 'sound-a-like' substitutions may have occurred due to the inherent limitations of voice recognition software.  Read the chart carefully and recognize, using context, where substitutions may have occurred.

## 2020-10-06 NOTE — Interval H&P Note (Signed)
History and Physical Interval Note: Preprocedure H&P from 10/06/20  was reviewed and there was no interval change after seeing and examining the patient.  Written consent was obtained from the patient after discussion of risks, benefits, and alternatives. Patient has consented to proceed with Esophagogastroduodenoscopy and Colonoscopy with possible intervention   10/06/2020 12:13 PM  Joyce West  has presented today for surgery, with the diagnosis of Diarrhea, unspecified type (R19.7) Weight loss (R63.4) Nausea and vomiting, unspecified vomiting type (R11.2).  The various methods of treatment have been discussed with the patient and family. After consideration of risks, benefits and other options for treatment, the patient has consented to  Procedure(s): COLONOSCOPY (N/A) ESOPHAGOGASTRODUODENOSCOPY (EGD) (N/A) as a surgical intervention.  The patient's history has been reviewed, patient examined, no change in status, stable for surgery.  I have reviewed the patient's chart and labs.  Questions were answered to the patient's satisfaction.     Annamaria Helling

## 2020-10-06 NOTE — Op Note (Signed)
[facility] Patient Name: Joyce West Procedure Date: 10/06/2020 12:58 PM MRN: [id number] Account #: [id number] Date of Birth: [birth year] Admit Type: Outpatient Age: 30 Room: [facility] ENDO ROOM 1 Gender: Female Note Status: Finalized Instrument Name: Colonoscope [id number] Procedure:             Colonoscopy Indications:           Chronic diarrhea Providers:             Annamaria Helling DO, DO Referring MD:          No Local Md, MD (Referring MD) Medicines:             Monitored Anesthesia Care Complications:         No immediate complications. Estimated blood loss:                         Minimal. Procedure:             Pre-Anesthesia Assessment:                        - Prior to the procedure, a History and Physical was                         performed, and patient medications and allergies were                         reviewed. The patient is competent. The risks and                         benefits of the procedure and the sedation options and                         risks were discussed with the patient. All questions                         were answered and informed consent was obtained.                         Patient identification and proposed procedure were                         verified by the physician, the nurse, the anesthetist                         and the technician in the endoscopy suite. Mental                         Status Examination: alert and oriented. Airway                         Examination: normal oropharyngeal airway and neck                         mobility. Respiratory Examination: clear to                         auscultation. CV Examination: RRR, no murmurs, no S3  or S4. Prophylactic Antibiotics: The patient does not                         require prophylactic antibiotics. Prior                         Anticoagulants: The patient has taken no previous                          anticoagulant or antiplatelet agents. ASA Grade                         Assessment: III - A patient with severe systemic                         disease. After reviewing the risks and benefits, the                         patient was deemed in satisfactory condition to                         undergo the procedure. The anesthesia plan was to use                         monitored anesthesia care (MAC). Immediately prior to                         administration of medications, the patient was                         re-assessed for adequacy to receive sedatives. The                         heart rate, respiratory rate, oxygen saturations,                         blood pressure, adequacy of pulmonary ventilation, and                         response to care were monitored throughout the                         procedure. The physical status of the patient was                         re-assessed after the procedure.                        After obtaining informed consent, the colonoscope was                         passed under direct vision. Throughout the procedure,                         the patient's blood pressure, pulse, and oxygen                         saturations were monitored continuously. The  Colonoscope was introduced through the anus and                         advanced to the the cecum, identified by appendiceal                         orifice and ileocecal valve. The colonoscopy was                         performed without difficulty. The patient tolerated                         the procedure well. The quality of the bowel                         preparation was evaluated using the BBPS [facility] Bowel                         Preparation Scale) with scores of: Right Colon = 3                         (entire mucosa seen well with no residual staining,                         small fragments of stool or opaque liquid), Transverse                          Colon = 3 (entire mucosa seen well with no residual                         staining, small fragments of stool or opaque liquid)                         and Left Colon = 2 (minor amount of residual staining,                         small fragments of stool and/or opaque liquid, but                         mucosa seen well). The total BBPS score equals 8. The                         quality of the bowel preparation was excellent. Findings:      The perianal and digital rectal examinations were normal. Pertinent       negatives include normal sphincter tone.      A few small-mouthed diverticula were found in the left colon. Estimated       blood loss: none.      A 2 to 3 mm polyp was found in the transverse colon. The polyp was       sessile. The polyp was removed with a cold biopsy forceps. Resection and       retrieval were complete. Estimated blood loss was minimal.      Non-bleeding internal hemorrhoids were found during retroflexion. The       hemorrhoids were Grade I (internal hemorrhoids that do not prolapse).      The exam was otherwise  without abnormality on direct and retroflexion       views. Impression:            - Diverticulosis in the left colon.                        - One 2 to 3 mm polyp in the transverse colon, removed                         with a cold biopsy forceps. Resected and retrieved.                        - Non-bleeding internal hemorrhoids.                        - The examination was otherwise normal on direct and                         retroflexion views. Recommendation:        - Discharge patient to home.                        - Resume previous diet.                        - Continue present medications.                        - Await pathology results.                        - Repeat colonoscopy for surveillance based on                         pathology results.                        - Return to referring physician as previously                          scheduled. Procedure Code(s):     --- Professional ---                        [phone], Colonoscopy, flexible; with biopsy, single or                         multiple Diagnosis Code(s):     --- Professional ---                        K64.0, First degree hemorrhoids                        K63.5, Polyp of colon                        K52.9, Noninfective gastroenteritis and colitis,                         unspecified                        K57.30, Diverticulosis of large intestine without  perforation or abscess without bleeding CPT copyright 2019 American Medical Association. All rights reserved. The codes documented in this report are preliminary and upon coder review may  be revised to meet current compliance requirements. Attending Participation:      I personally performed the entire procedure. Volney American, DO Annamaria Helling DO, DO 10/06/2020 1:50:23 PM This report has been signed electronically. Number of Addenda: 0 Note Initiated On: 10/06/2020 12:58 PM Scope Withdrawal Time: 0 hours 12 minutes 14 seconds  Total Procedure Duration: 0 hours 21 minutes 55 seconds  Estimated Blood Loss:  Estimated blood loss was minimal.      [facility]

## 2020-10-06 NOTE — Op Note (Signed)
[facility] Patient Name: Joyce West Procedure Date: 10/06/2020 1:00 PM MRN: [id number] Account #: [id number] Date of Birth: 12/20/1990 Admit Type: Outpatient Age: 30 Room: [facility] ENDO ROOM 1 Gender: Female Note Status: Finalized Instrument Name: Upper Endoscope [id number] Procedure:             Upper GI endoscopy Indications:           Diarrhea, Early satiety, Nausea with vomiting Providers:             Annamaria Helling DO, DO Referring MD:          No Local Md, MD (Referring MD) Medicines:             Monitored Anesthesia Care Complications:         No immediate complications. Estimated blood loss:                         Minimal. Procedure:             Pre-Anesthesia Assessment:                        - Prior to the procedure, a History and Physical was                         performed, and patient medications and allergies were                         reviewed. The patient is competent. The risks and                         benefits of the procedure and the sedation options and                         risks were discussed with the patient. All questions                         were answered and informed consent was obtained.                         Patient identification and proposed procedure were                         verified by the physician, the nurse, the anesthetist                         and the technician in the endoscopy suite. Mental                         Status Examination: alert and oriented. Airway                         Examination: normal oropharyngeal airway and neck                         mobility. Respiratory Examination: clear to                         auscultation. CV Examination: RRR, no murmurs, no S3  or S4. Prophylactic Antibiotics: The patient does not                         require prophylactic antibiotics. Prior                         Anticoagulants: The patient has taken no  previous                         anticoagulant or antiplatelet agents. ASA Grade                         Assessment: III - A patient with severe systemic                         disease. After reviewing the risks and benefits, the                         patient was deemed in satisfactory condition to                         undergo the procedure. The anesthesia plan was to use                         monitored anesthesia care (MAC). Immediately prior to                         administration of medications, the patient was                         re-assessed for adequacy to receive sedatives. The                         heart rate, respiratory rate, oxygen saturations,                         blood pressure, adequacy of pulmonary ventilation, and                         response to care were monitored throughout the                         procedure. The physical status of the patient was                         re-assessed after the procedure.                        After obtaining informed consent, the endoscope was                         passed under direct vision. Throughout the procedure,                         the patient's blood pressure, pulse, and oxygen                         saturations were monitored continuously. The  Endosonoscope was introduced through the mouth, and                         advanced to the second part of duodenum. The upper GI                         endoscopy was accomplished without difficulty. The                         patient tolerated the procedure well. Findings:      Localized mild inflammation characterized by erythema was found in the       duodenal bulb. Biopsies for histology were taken with a cold forceps for       evaluation of celiac disease. Estimated blood loss was minimal.      Localized mild inflammation characterized by erythema was found in the       gastric antrum. Biopsies were taken with a cold forceps for  Helicobacter       pylori testing. Estimated blood loss was minimal.      The Z-line was irregular and was found 35 cm from the incisors. GEJ was       ~35 cm from incisors. Island of salmon colored mucosa noted. Biopsies       were taken with a cold forceps for histology rule out Barretts.       Estimated blood loss was minimal.      The exam of the esophagus was otherwise normal. Impression:            - Duodenitis. Biopsied.                        - Gastritis. Biopsied.                        - Z-line irregular, 35 cm from the incisors. Biopsied. Recommendation:        - Discharge patient to home.                        - Resume previous diet.                        - Continue present medications.                        - Await pathology results.                        - Return to referring physician as previously                         scheduled. Procedure Code(s):     --- Professional ---                        [phone], Esophagogastroduodenoscopy, flexible,                         transoral; with biopsy, single or multiple Diagnosis Code(s):     --- Professional ---                        K29.80, Duodenitis without bleeding  K29.70, Gastritis, unspecified, without bleeding                        K22.8, Other specified diseases of esophagus                        R19.7, Diarrhea, unspecified                        R68.81, Early satiety                        R11.2, Nausea with vomiting, unspecified CPT copyright 2019 American Medical Association. All rights reserved. The codes documented in this report are preliminary and upon coder review may  be revised to meet current compliance requirements. Attending Participation:      I personally performed the entire procedure. Volney American, DO Annamaria Helling DO, DO 10/06/2020 1:47:07 PM This report has been signed electronically. Number of Addenda: 0 Note Initiated On: 10/06/2020 1:00 PM Estimated Blood Loss:   Estimated blood loss was minimal.      [facility]

## 2020-10-07 ENCOUNTER — Encounter: Payer: Self-pay | Admitting: Gastroenterology

## 2020-10-07 LAB — SURGICAL PATHOLOGY

## 2020-10-07 NOTE — Anesthesia Postprocedure Evaluation (Signed)
Anesthesia Post Note  Patient: Joyce West  Procedure(s) Performed: COLONOSCOPY ESOPHAGOGASTRODUODENOSCOPY (EGD)  Patient location during evaluation: Endoscopy Anesthesia Type: General Level of consciousness: awake and alert Pain management: pain level controlled Vital Signs Assessment: post-procedure vital signs reviewed and stable Respiratory status: spontaneous breathing, nonlabored ventilation, respiratory function stable and patient connected to nasal cannula oxygen Cardiovascular status: blood pressure returned to baseline and stable Postop Assessment: no apparent nausea or vomiting Anesthetic complications: no   No notable events documented.   Last Vitals:  Vitals:   10/06/20 1418 10/06/20 1423  BP:  (!) 162/90  Pulse: 76 77  Resp: 18 18  Temp:    SpO2: 90% 90%    Last Pain:  Vitals:   10/07/20 0744  TempSrc:   PainSc: 0-No pain                 Precious Haws Kolette Vey

## 2021-01-11 ENCOUNTER — Encounter (INDEPENDENT_AMBULATORY_CARE_PROVIDER_SITE_OTHER): Payer: Medicare Other | Admitting: Nurse Practitioner

## 2021-01-11 ENCOUNTER — Encounter (INDEPENDENT_AMBULATORY_CARE_PROVIDER_SITE_OTHER): Payer: Medicare Other

## 2021-02-23 ENCOUNTER — Other Ambulatory Visit: Payer: Self-pay | Admitting: Nephrology

## 2021-02-23 DIAGNOSIS — Z992 Dependence on renal dialysis: Secondary | ICD-10-CM

## 2021-02-23 DIAGNOSIS — N186 End stage renal disease: Secondary | ICD-10-CM

## 2021-02-23 NOTE — Progress Notes (Signed)
CXR ordered 

## 2021-03-23 IMAGING — CT CT HEAD W/O CM
3 series · 15 of 46 positions shown, 18 images · non-contrast
Comparison: None.

CLINICAL DATA: Right-sided weakness

EXAM:
CT HEAD WITHOUT CONTRAST
TECHNIQUE: Contiguous axial images were obtained from the base of the skull
through the vertex without intravenous contrast.

[Series 2: head wo · axial · 0.42mm/px · z∈[-130,-10]mm · 9 of 29 slices shown, 12 images]
[im 3/29  brain]
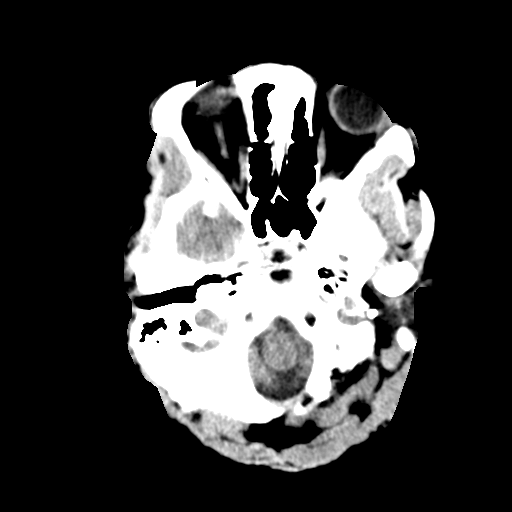
[im 3/29  bone]
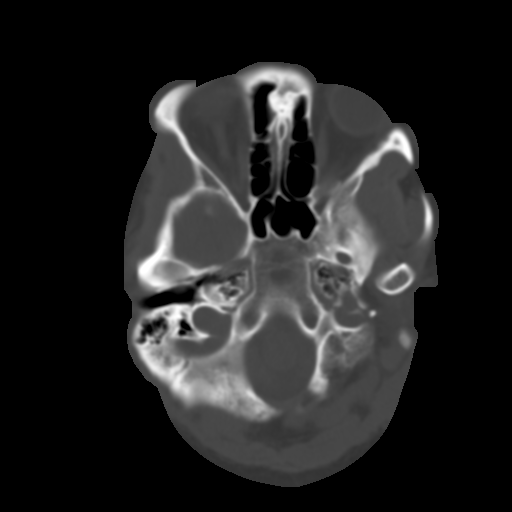
[im 6/29  brain]
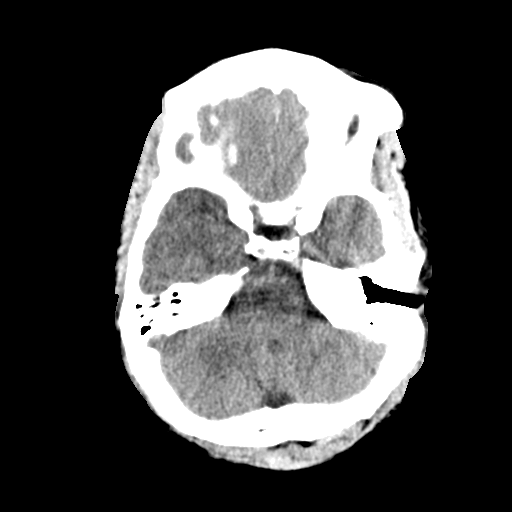
[im 9/29  brain]
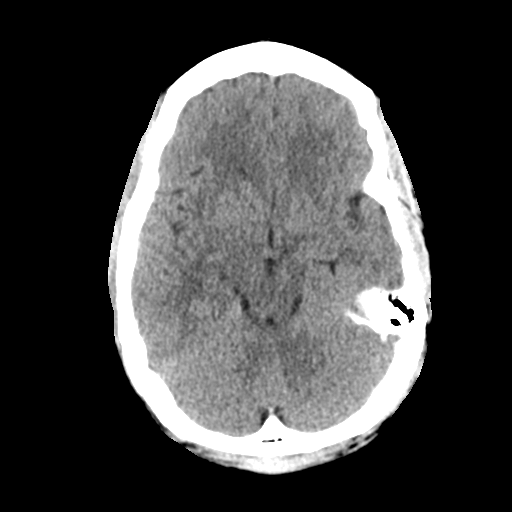
[im 12/29  brain]
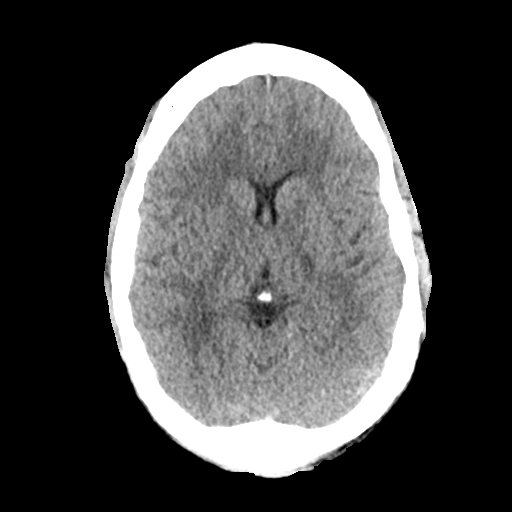
[im 15/29  brain]
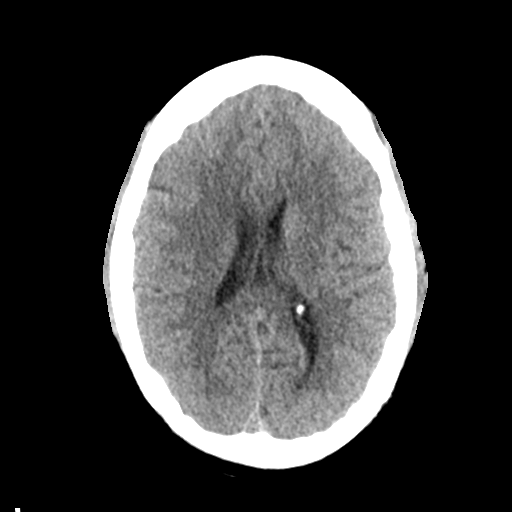
[im 15/29  bone]
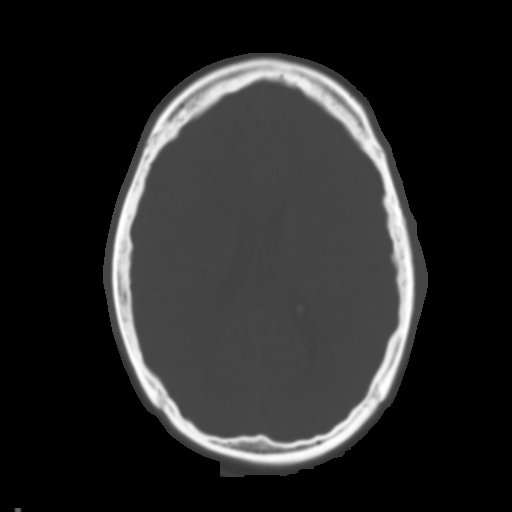
[im 18/29  brain]
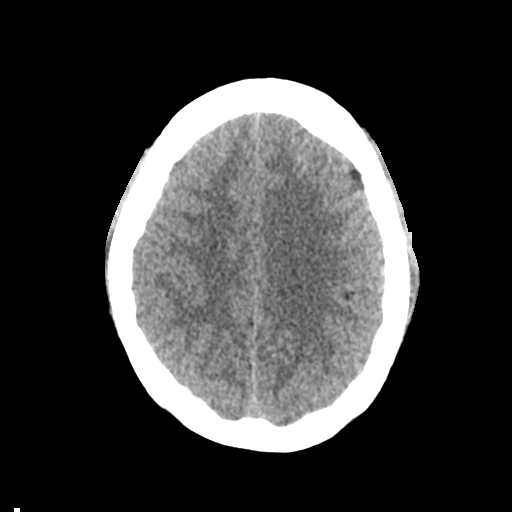
[im 21/29  brain]
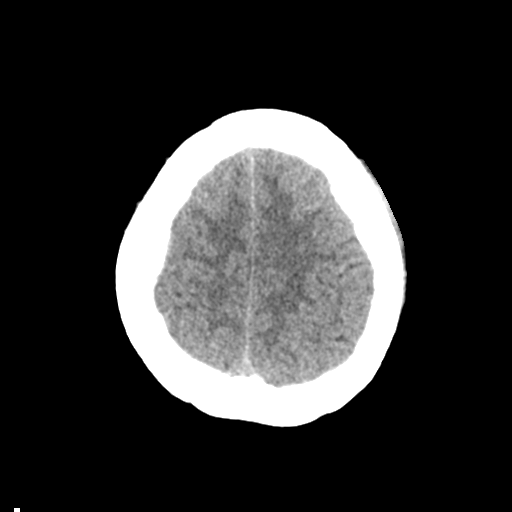
[im 24/29  brain]
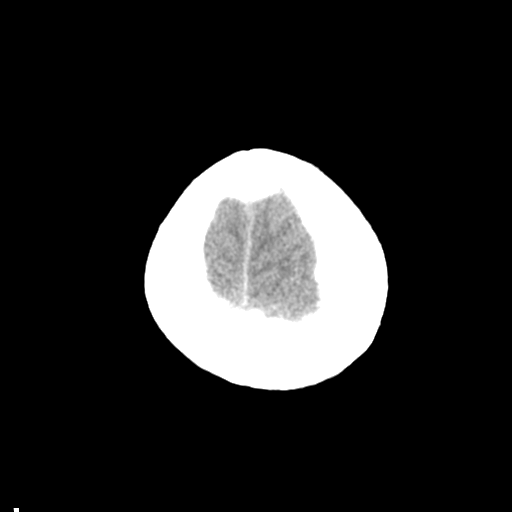
[im 27/29  brain]
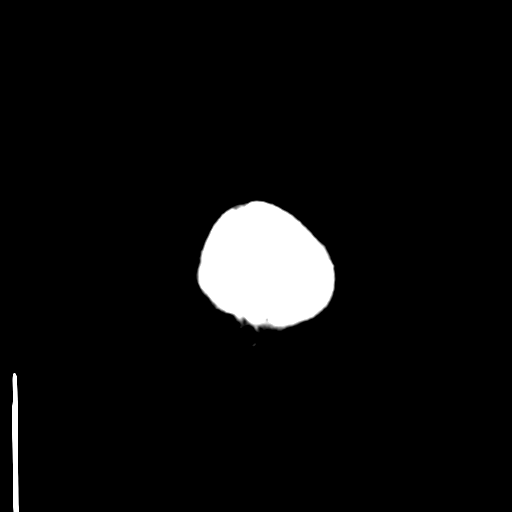
[im 27/29  bone]
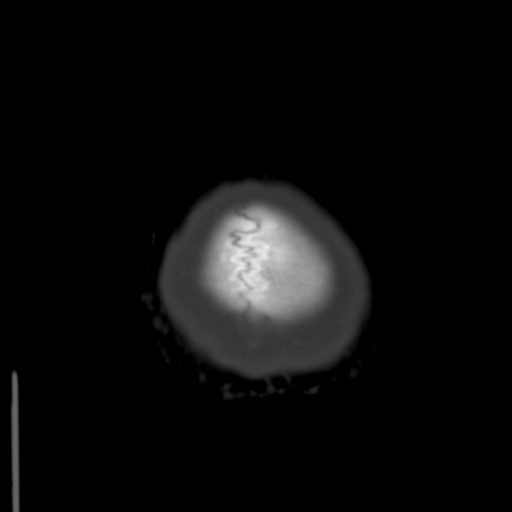

[Series 4: coronal soft tissue · coronal · 0.29mm/px · 3 of 59 slices shown]
[im 20/59  brain]
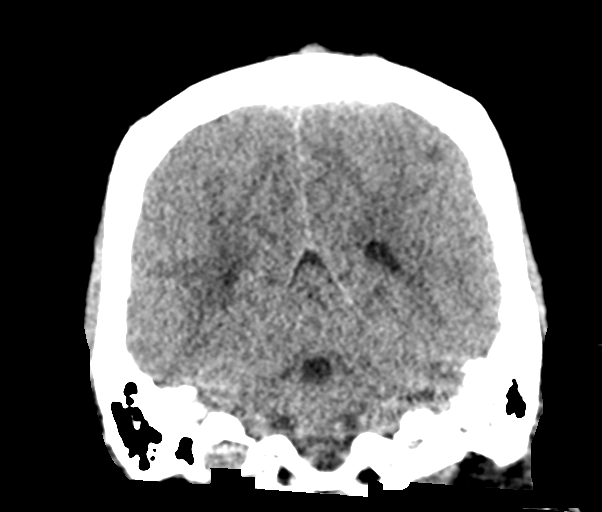
[im 26/59  brain]
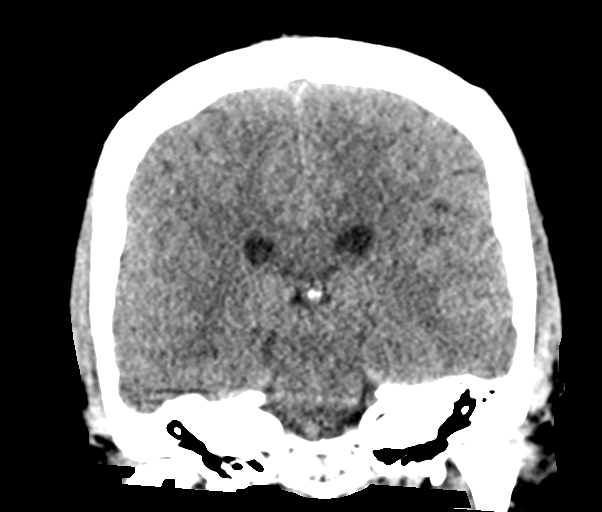
[im 33/59  brain]
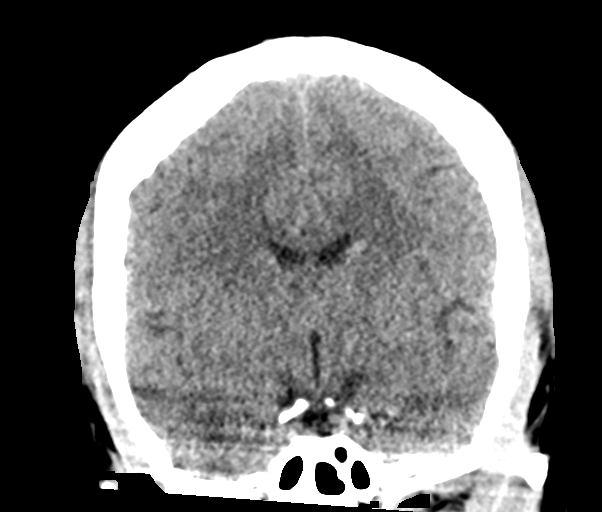

[Series 5: sagittal soft tissue · sagittal · 0.29mm/px · 3 of 49 slices shown]
[im 17/49  brain]
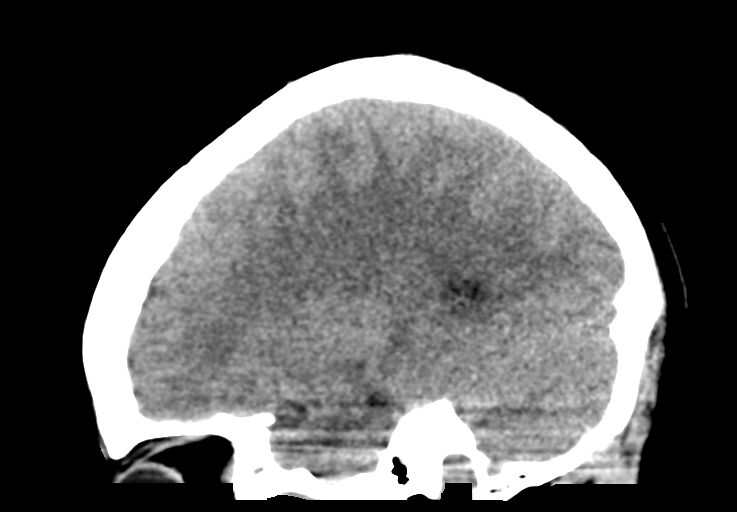
[im 25/49  brain]
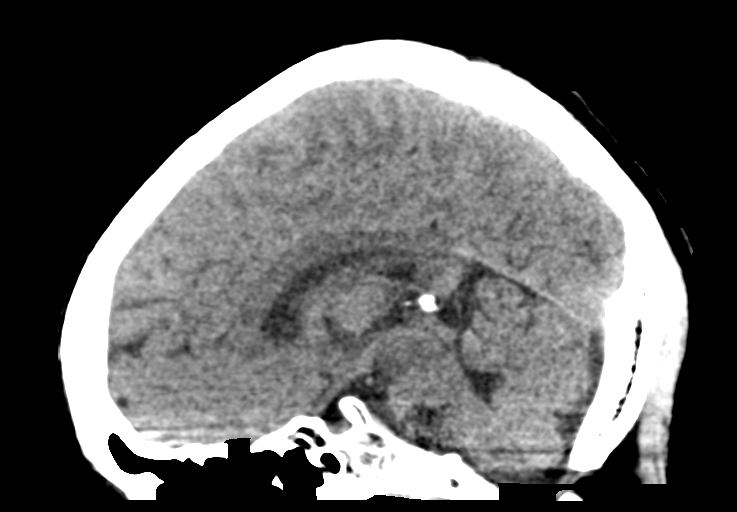
[im 33/49  brain]
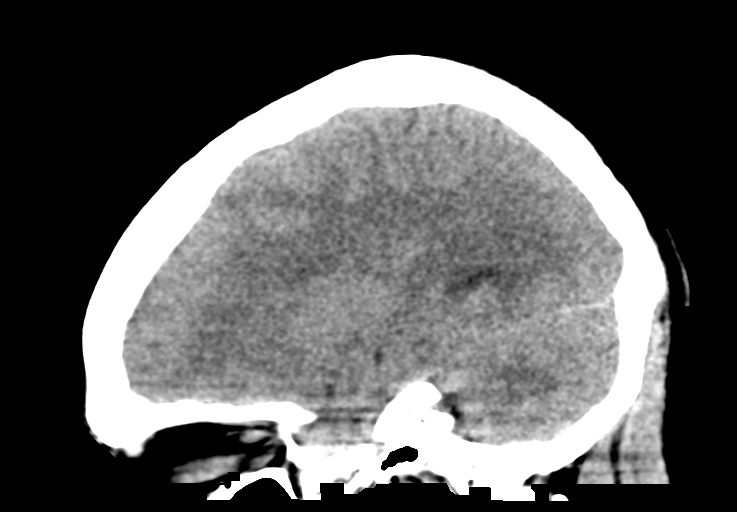

[15 of 46 positions shown; findings below may reference images not displayed]

FINDINGS: Brain: No acute territorial infarction, hemorrhage or intracranial
mass. The ventricles are nonenlarged.

Vascular: No hyperdense vessels. No unexpected calcification

Skull: Normal. Negative for fracture or focal lesion.

Sinuses/Orbits: No acute finding.

Other: None
IMPRESSION: Negative non contrasted CT appearance of the brain.

## 2021-05-03 ENCOUNTER — Telehealth (INDEPENDENT_AMBULATORY_CARE_PROVIDER_SITE_OTHER): Payer: Self-pay

## 2021-05-03 NOTE — Telephone Encounter (Signed)
Eulas Post called concerning pt being being established with [place].  I let him know that unfortunately she is not a pt here.  He will figure out how to get the records we need in order for [place] to she her here as a pt.  ?

## 2021-05-23 ENCOUNTER — Other Ambulatory Visit (INDEPENDENT_AMBULATORY_CARE_PROVIDER_SITE_OTHER): Payer: Self-pay | Admitting: Vascular Surgery

## 2021-05-23 DIAGNOSIS — N186 End stage renal disease: Secondary | ICD-10-CM

## 2021-05-26 ENCOUNTER — Ambulatory Visit (INDEPENDENT_AMBULATORY_CARE_PROVIDER_SITE_OTHER): Payer: Medicare Other | Admitting: Nurse Practitioner

## 2021-05-26 ENCOUNTER — Encounter (INDEPENDENT_AMBULATORY_CARE_PROVIDER_SITE_OTHER): Payer: Medicare Other

## 2021-05-29 ENCOUNTER — Ambulatory Visit (INDEPENDENT_AMBULATORY_CARE_PROVIDER_SITE_OTHER): Payer: Medicare Other | Admitting: Nurse Practitioner

## 2021-05-29 ENCOUNTER — Encounter (INDEPENDENT_AMBULATORY_CARE_PROVIDER_SITE_OTHER): Payer: Medicare Other

## 2021-06-07 ENCOUNTER — Ambulatory Visit (INDEPENDENT_AMBULATORY_CARE_PROVIDER_SITE_OTHER): Payer: Medicare Other | Admitting: Nurse Practitioner

## 2021-06-07 ENCOUNTER — Encounter (INDEPENDENT_AMBULATORY_CARE_PROVIDER_SITE_OTHER): Payer: Medicare Other

## 2021-11-06 ENCOUNTER — Encounter (INDEPENDENT_AMBULATORY_CARE_PROVIDER_SITE_OTHER): Payer: Self-pay
